# Patient Record
Sex: Female | Born: 1980 | Race: White | Hispanic: No | Marital: Single | Smoking: Current every day smoker
Health system: Southern US, Community
[De-identification: ages and names within clinical notes are randomized; demographics above are authoritative.]

## PROBLEM LIST (undated history)

## (undated) DIAGNOSIS — Z87442 Personal history of urinary calculi: Secondary | ICD-10-CM

## (undated) DIAGNOSIS — N189 Chronic kidney disease, unspecified: Secondary | ICD-10-CM

## (undated) DIAGNOSIS — R87629 Unspecified abnormal cytological findings in specimens from vagina: Secondary | ICD-10-CM

## (undated) HISTORY — DX: Unspecified abnormal cytological findings in specimens from vagina: R87.629

## (undated) HISTORY — PX: LEEP: SHX91

---

## 2002-05-14 ENCOUNTER — Emergency Department (HOSPITAL_COMMUNITY): Admission: EM | Admit: 2002-05-14 | Discharge: 2002-05-14 | Payer: Self-pay | Admitting: *Deleted

## 2003-03-06 ENCOUNTER — Emergency Department (HOSPITAL_COMMUNITY): Admission: EM | Admit: 2003-03-06 | Discharge: 2003-03-06 | Payer: Self-pay | Admitting: Emergency Medicine

## 2003-07-21 ENCOUNTER — Ambulatory Visit (HOSPITAL_COMMUNITY): Admission: RE | Admit: 2003-07-21 | Discharge: 2003-07-21 | Payer: Self-pay | Admitting: Orthopaedic Surgery

## 2010-07-26 ENCOUNTER — Ambulatory Visit (HOSPITAL_COMMUNITY)
Admission: RE | Admit: 2010-07-26 | Discharge: 2010-07-26 | Disposition: A | Discharge status: Home / Self Care | Payer: 59 | Source: Ambulatory Visit | Attending: Orthopaedic Surgery | Admitting: Orthopaedic Surgery

## 2010-07-26 DIAGNOSIS — M545 Low back pain, unspecified: Secondary | ICD-10-CM | POA: Insufficient documentation

## 2010-07-26 DIAGNOSIS — M6281 Muscle weakness (generalized): Secondary | ICD-10-CM | POA: Insufficient documentation

## 2010-07-26 DIAGNOSIS — IMO0001 Reserved for inherently not codable concepts without codable children: Secondary | ICD-10-CM | POA: Insufficient documentation

## 2010-07-30 ENCOUNTER — Ambulatory Visit (HOSPITAL_COMMUNITY)
Admission: RE | Admit: 2010-07-30 | Discharge: 2010-07-30 | Disposition: A | Discharge status: Home / Self Care | Payer: 59 | Source: Ambulatory Visit | Attending: Orthopaedic Surgery | Admitting: Orthopaedic Surgery

## 2010-07-30 DIAGNOSIS — M545 Low back pain, unspecified: Secondary | ICD-10-CM | POA: Insufficient documentation

## 2010-07-30 DIAGNOSIS — M6281 Muscle weakness (generalized): Secondary | ICD-10-CM | POA: Insufficient documentation

## 2010-07-30 DIAGNOSIS — IMO0001 Reserved for inherently not codable concepts without codable children: Secondary | ICD-10-CM | POA: Insufficient documentation

## 2010-08-02 ENCOUNTER — Ambulatory Visit (HOSPITAL_COMMUNITY): Payer: 59 | Admitting: Physical Therapy

## 2010-08-05 ENCOUNTER — Ambulatory Visit (HOSPITAL_COMMUNITY)
Admission: RE | Admit: 2010-08-05 | Discharge: 2010-08-05 | Disposition: A | Discharge status: Home / Self Care | Payer: 59 | Source: Ambulatory Visit | Attending: Orthopaedic Surgery | Admitting: Orthopaedic Surgery

## 2010-08-09 ENCOUNTER — Ambulatory Visit (HOSPITAL_COMMUNITY)
Admission: RE | Admit: 2010-08-09 | Discharge: 2010-08-09 | Disposition: A | Discharge status: Home / Self Care | Payer: 59 | Source: Ambulatory Visit | Attending: *Deleted | Admitting: *Deleted

## 2010-08-12 ENCOUNTER — Ambulatory Visit (HOSPITAL_COMMUNITY)
Admission: RE | Admit: 2010-08-12 | Discharge: 2010-08-12 | Disposition: A | Discharge status: Home / Self Care | Payer: 59 | Source: Ambulatory Visit | Attending: *Deleted | Admitting: *Deleted

## 2012-08-04 ENCOUNTER — Encounter (HOSPITAL_COMMUNITY): Payer: Self-pay | Admitting: *Deleted

## 2012-08-04 ENCOUNTER — Emergency Department (HOSPITAL_COMMUNITY)
Admission: EM | Admit: 2012-08-04 | Discharge: 2012-08-04 | Disposition: A | Discharge status: Home / Self Care | Payer: 59 | Attending: Emergency Medicine | Admitting: Emergency Medicine

## 2012-08-04 ENCOUNTER — Emergency Department (HOSPITAL_COMMUNITY): Payer: 59

## 2012-08-04 DIAGNOSIS — Z3202 Encounter for pregnancy test, result negative: Secondary | ICD-10-CM | POA: Insufficient documentation

## 2012-08-04 DIAGNOSIS — N201 Calculus of ureter: Secondary | ICD-10-CM | POA: Insufficient documentation

## 2012-08-04 DIAGNOSIS — Z9889 Other specified postprocedural states: Secondary | ICD-10-CM | POA: Insufficient documentation

## 2012-08-04 DIAGNOSIS — R112 Nausea with vomiting, unspecified: Secondary | ICD-10-CM | POA: Insufficient documentation

## 2012-08-04 DIAGNOSIS — F17200 Nicotine dependence, unspecified, uncomplicated: Secondary | ICD-10-CM | POA: Insufficient documentation

## 2012-08-04 LAB — URINALYSIS, ROUTINE W REFLEX MICROSCOPIC
Bilirubin Urine: NEGATIVE
Glucose, UA: NEGATIVE mg/dL
Ketones, ur: NEGATIVE mg/dL
Protein, ur: NEGATIVE mg/dL
pH: 6 (ref 5.0–8.0)

## 2012-08-04 MED ORDER — OXYCODONE-ACETAMINOPHEN 5-325 MG PO TABS: 2.0000 | ORAL_TABLET | ORAL | Status: DC | PRN

## 2012-08-04 MED ORDER — OXYCODONE-ACETAMINOPHEN 5-325 MG PO TABS
2.0000 | ORAL_TABLET | Freq: Once | ORAL | Status: AC
  Administered 2012-08-04: 2 via ORAL
  Filled 2012-08-04: qty 2

## 2012-08-04 MED ORDER — HYDROMORPHONE HCL PF 1 MG/ML IJ SOLN
1.0000 mg | Freq: Once | INTRAMUSCULAR | Status: AC
  Administered 2012-08-04: 1 mg via INTRAVENOUS
  Filled 2012-08-04: qty 1

## 2012-08-04 MED ORDER — ONDANSETRON HCL 4 MG PO TABS: 4.0000 mg | ORAL_TABLET | Freq: Four times a day (QID) | ORAL | Status: DC

## 2012-08-04 MED ORDER — KETOROLAC TROMETHAMINE 30 MG/ML IJ SOLN
30.0000 mg | Freq: Once | INTRAMUSCULAR | Status: AC
  Administered 2012-08-04: 30 mg via INTRAVENOUS
  Filled 2012-08-04 (×2): qty 1

## 2012-08-04 MED ORDER — ONDANSETRON HCL 4 MG/2ML IJ SOLN
4.0000 mg | Freq: Once | INTRAMUSCULAR | Status: AC
  Administered 2012-08-04: 4 mg via INTRAVENOUS
  Filled 2012-08-04: qty 2

## 2012-08-04 MED ORDER — SODIUM CHLORIDE 0.9 % IV SOLN
INTRAVENOUS | Status: DC
  Administered 2012-08-04: 1000 mL via INTRAVENOUS

## 2012-08-04 NOTE — ED Notes (Signed)
Pt reports right sided flank pain that started about 1 hour ago. Pt w a history of kidney infections. Vomiting started after the pain.

## 2012-08-04 NOTE — ED Provider Notes (Signed)
History     CSN: [id number]  Arrival date & time 08/04/12  0022   First MD Initiated Contact with Patient 08/04/12 0037      Chief Complaint  Patient presents with  . Flank Pain    (Consider location/radiation/quality/duration/timing/severity/associated sxs/prior treatment) HPI Hx per PT, R flank pain onset tonight with N/V, pain is severe, sharp not radiating, no associated hematuria or dysuria, no F/C. No trauma. No h/o same. Pain constant and no known aggrevating or alleviating factors History reviewed. No pertinent past medical history.  Past Surgical History  Procedure Laterality Date  . Cesarean section      History reviewed. No pertinent family history.  History  Substance Use Topics  . Smoking status: Current Every Day Smoker -- 0.50 packs/day    Types: Cigarettes  . Smokeless tobacco: Not on file  . Alcohol Use: Yes     Comment: occ    OB History   Grav Para Term Preterm Abortions TAB SAB Ect Mult Living                  Review of Systems  Constitutional: Negative for fever and chills.  HENT: Negative for neck pain and neck stiffness.   Eyes: Negative for pain.  Respiratory: Negative for shortness of breath.   Cardiovascular: Negative for chest pain.  Gastrointestinal: Negative for abdominal pain.  Genitourinary: Positive for flank pain. Negative for dysuria.  Musculoskeletal: Negative for back pain.  Skin: Negative for rash.  Neurological: Negative for headaches.  All other systems reviewed and are negative.    Allergies  Review of patient's allergies indicates no known allergies.  Home Medications   Current Outpatient Rx  Name  Route  Sig  Dispense  Refill  . naproxen (NAPROSYN) 250 MG tablet   Oral   Take 250 mg by mouth 2 (two) times daily with a meal.         . ondansetron (ZOFRAN) 4 MG tablet   Oral   Take 1 tablet (4 mg total) by mouth every 6 (six) hours.   12 tablet   0   . oxyCODONE-acetaminophen (PERCOCET/ROXICET) 5-325 MG  per tablet   Oral   Take 2 tablets by mouth every 4 (four) hours as needed for pain.   15 tablet   0     BP 126/79  Pulse 88  Temp(Src) 98.4 F (36.9 C) (Oral)  Resp 18  Ht 5\' 4"  (1.626 m)  Wt 140 lb (63.504 kg)  BMI 24.02 kg/m2  SpO2 94%  Physical Exam  Constitutional: She is oriented to person, place, and time. She appears well-developed and well-nourished.  HENT:  Head: Normocephalic and atraumatic.  Eyes: EOM are normal. Pupils are equal, round, and reactive to light.  Neck: Neck supple.  Cardiovascular: Normal rate, regular rhythm and intact distal pulses.   Pulmonary/Chest: Effort normal and breath sounds normal. No respiratory distress.  Abdominal: She exhibits no distension. There is no guarding.  TTP R flank, neg Murphys sign, no ABD tenderness otherwise  Musculoskeletal: Normal range of motion. She exhibits no edema.  Neurological: She is alert and oriented to person, place, and time.  Skin: Skin is warm and dry.    ED Course  Procedures (including critical care time)  Labs Reviewed  URINALYSIS, ROUTINE W REFLEX MICROSCOPIC - Abnormal; Notable for the following:    Hgb urine dipstick LARGE (*)    All other components within normal limits  PREGNANCY, URINE  URINE MICROSCOPIC-ADD ON   Ct  Abdomen Pelvis Wo Contrast  08/04/2012  *RADIOLOGY REPORT*  Clinical Data: Right-sided flank pain for 2 hours.  CT ABDOMEN AND PELVIS WITHOUT CONTRAST  Technique:  Multidetector CT imaging of the abdomen and pelvis was performed following the standard protocol without intravenous contrast.  Comparison: MRI of the lumbar spine performed 07/21/2003  Findings: The visualized lung bases are clear.  The liver and spleen are unremarkable in appearance.  The gallbladder is within normal limits.  The pancreas and adrenal glands are unremarkable.  There is minimal right-sided hydronephrosis, with prominence of the right ureter along its entire course, and an obstructing 5 x 4 mm stone noted  distally, 2 cm proximal to the right vesicoureteral junction.  The left kidney is unremarkable in appearance.  No non-obstructing renal stones are identified.  No perinephric stranding is seen.  No free fluid is identified.  The small bowel is unremarkable in appearance.  The stomach is within normal limits.  No acute vascular abnormalities are seen.  The appendix is normal in caliber and contains air, without evidence for appendicitis.  The colon is unremarkable in appearance.  The bladder is decompressed and not well assessed.  The uterus is grossly unremarkable in appearance.  The ovaries are relatively symmetric; no suspicious adnexal masses are seen.  No inguinal lymphadenopathy is seen.  No acute osseous abnormalities are identified.  IMPRESSION: Minimal right-sided hydronephrosis, with an obstructing 5 x 4 mm stone distally in the right ureter, 2 cm proximal to the right vesicoureteral junction.   Original Report Authenticated By: Tonia Ghent, M.D.      1. Ureterolithiasis    IVFs, IV Dilaudid, IV toradol, IV zofran  Pain improved - plan RX and Urology follow up with kidney stone precautions verbalized as understood  MDM  Flank pain. Ureterolithiasis - evaluated with CT scan as above, UA showing no UTI  Improved with IVfs and narcotics  VS and nursing notes reviewed        Sunnie Nielsen, MD 08/04/12 [phone]

## 2012-08-04 NOTE — ED Notes (Signed)
Pt alert & oriented x4, stable gait. Patient given discharge instructions, paperwork & prescription(s). Patient  instructed to stop at the registration desk to finish any additional paperwork. Patient verbalized understanding. Pt left department w/ no further questions. 

## 2012-08-06 ENCOUNTER — Other Ambulatory Visit: Payer: Self-pay | Admitting: Urology

## 2012-08-07 ENCOUNTER — Encounter (HOSPITAL_COMMUNITY): Payer: Self-pay | Admitting: *Deleted

## 2012-08-07 ENCOUNTER — Emergency Department (HOSPITAL_COMMUNITY)
Admission: EM | Admit: 2012-08-07 | Discharge: 2012-08-08 | Disposition: A | Discharge status: Home / Self Care | Payer: 59 | Attending: Emergency Medicine | Admitting: Emergency Medicine

## 2012-08-07 DIAGNOSIS — N189 Chronic kidney disease, unspecified: Secondary | ICD-10-CM | POA: Insufficient documentation

## 2012-08-07 DIAGNOSIS — R350 Frequency of micturition: Secondary | ICD-10-CM | POA: Insufficient documentation

## 2012-08-07 DIAGNOSIS — N23 Unspecified renal colic: Secondary | ICD-10-CM | POA: Insufficient documentation

## 2012-08-07 DIAGNOSIS — N201 Calculus of ureter: Secondary | ICD-10-CM | POA: Insufficient documentation

## 2012-08-07 DIAGNOSIS — R319 Hematuria, unspecified: Secondary | ICD-10-CM | POA: Insufficient documentation

## 2012-08-07 DIAGNOSIS — Z79899 Other long term (current) drug therapy: Secondary | ICD-10-CM | POA: Insufficient documentation

## 2012-08-07 DIAGNOSIS — F17200 Nicotine dependence, unspecified, uncomplicated: Secondary | ICD-10-CM | POA: Insufficient documentation

## 2012-08-07 DIAGNOSIS — R110 Nausea: Secondary | ICD-10-CM | POA: Insufficient documentation

## 2012-08-07 NOTE — Pre-Procedure Instructions (Signed)
Asked to bring blue folder the day of the procedure,insurance card,I.D. driver's license,wear comfortable clothing and have a driver for the day. Asked not to take Advil,Motrin,Ibuprofen,Aleve or any NSAIDS, Aspirin, or Toradol for 72 hours prior to procedure,  No vitamins or herbal medications 7 days prior to procedure. Instructed to take laxative per doctor's office instructions and eat a light dinner the evening before procedure.   To arrive at 0530  for lithotripsy procedure. 

## 2012-08-07 NOTE — ED Notes (Signed)
Pt reporting pain in right flank.  States that she was seen here on Sat and already been diagnosed with kidney stones.  Has appointment for lithotripsy on Monday.  States that her nephrologist told her to come to ED since prescribed pain medications weren't managing pain.

## 2012-08-08 ENCOUNTER — Encounter (HOSPITAL_COMMUNITY): Payer: Self-pay | Admitting: Pharmacy Technician

## 2012-08-08 LAB — URINALYSIS, ROUTINE W REFLEX MICROSCOPIC
Bilirubin Urine: NEGATIVE
Glucose, UA: NEGATIVE mg/dL
Specific Gravity, Urine: 1.02 (ref 1.005–1.030)
Urobilinogen, UA: 0.2 mg/dL (ref 0.0–1.0)
pH: 5.5 (ref 5.0–8.0)

## 2012-08-08 LAB — URINE MICROSCOPIC-ADD ON

## 2012-08-08 MED ORDER — HYDROMORPHONE HCL 2 MG PO TABS
ORAL_TABLET | ORAL | Status: AC
  Filled 2012-08-08: qty 1

## 2012-08-08 MED ORDER — HYDROMORPHONE HCL 2 MG PO TABS
2.0000 mg | ORAL_TABLET | Freq: Once | ORAL | Status: AC
  Administered 2012-08-08: 2 mg via ORAL

## 2012-08-08 MED ORDER — HYDROMORPHONE HCL 2 MG PO TABS
4.0000 mg | ORAL_TABLET | Freq: Once | ORAL | Status: AC
  Administered 2012-08-08: 4 mg via ORAL

## 2012-08-08 MED ORDER — HYDROMORPHONE HCL 4 MG PO TABS: 4.0000 mg | ORAL_TABLET | ORAL | Status: DC | PRN

## 2012-08-08 MED ORDER — ONDANSETRON 8 MG PO TBDP
8.0000 mg | ORAL_TABLET | Freq: Once | ORAL | Status: AC
  Administered 2012-08-08: 8 mg via ORAL

## 2012-08-08 MED ORDER — HYDROMORPHONE HCL 2 MG PO TABS
ORAL_TABLET | ORAL | Status: AC
  Filled 2012-08-08: qty 2

## 2012-08-08 MED ORDER — ONDANSETRON 8 MG PO TBDP
ORAL_TABLET | ORAL | Status: AC
  Filled 2012-08-08: qty 1

## 2012-08-08 NOTE — ED Notes (Signed)
See downtime paperwork on patient.

## 2012-08-11 NOTE — ED Provider Notes (Signed)
History     CSN: [id number]  Arrival date & time 08/07/12  2322   First MD Initiated Contact with Patient 08/08/12 0016      Chief Complaint  Patient presents with  . Flank Pain    (Consider location/radiation/quality/duration/timing/severity/associated sxs/prior treatment) HPI Comments: Wendy Powell is a 32 y.o. Female presenting with persistent right flank pain despite taking oxycodone which she was prescribed here 4 days ago at which time she was diagnosed with a right UVJ stone measuring 5 x 4 mm.  She is scheduled for lithotripsy with Dr. Berneice Heinrich in 5 days.  She has had nausea without emesis and denies fever,  But has has pain only minimally controlled with the oxycodone prescribed.  She has taken no other medicines for pain relief.  She has had urinary frequency and hematuria. Her pain is waxing and waning with radiation into her right groin.  She has found no alleviators or aggrevators.     The history is provided by the patient.    Past Medical History  Diagnosis Date  . Chronic kidney disease     kidney stone    Past Surgical History  Procedure Laterality Date  . Cesarean section      History reviewed. No pertinent family history.  History  Substance Use Topics  . Smoking status: Current Every Day Smoker -- 0.50 packs/day for 17 years    Types: Cigarettes  . Smokeless tobacco: Never Used  . Alcohol Use: Yes     Comment: occ    OB History   Grav Para Term Preterm Abortions TAB SAB Ect Mult Living                  Review of Systems  Constitutional: Negative for fever.  HENT: Negative for congestion, sore throat and neck pain.   Eyes: Negative.   Respiratory: Negative for chest tightness and shortness of breath.   Cardiovascular: Negative for chest pain.  Gastrointestinal: Negative for nausea and abdominal pain.  Genitourinary: Positive for hematuria and flank pain.  Musculoskeletal: Negative for joint swelling and arthralgias.  Skin: Negative.   Negative for rash and wound.  Neurological: Negative for dizziness, weakness, light-headedness, numbness and headaches.  Psychiatric/Behavioral: Negative.     Allergies  Review of patient's allergies indicates no known allergies.  Home Medications   Current Outpatient Rx  Name  Route  Sig  Dispense  Refill  . HYDROmorphone (DILAUDID) 4 MG tablet   Oral   Take 1 tablet (4 mg total) by mouth every 4 (four) hours as needed for pain.   30 tablet   0   . ondansetron (ZOFRAN) 4 MG tablet   Oral   Take 1 tablet (4 mg total) by mouth every 6 (six) hours.   12 tablet   0   . oxyCODONE-acetaminophen (PERCOCET/ROXICET) 5-325 MG per tablet   Oral   Take 2 tablets by mouth every 4 (four) hours as needed for pain.   15 tablet   0   . tamsulosin (FLOMAX) 0.4 MG CAPS   Oral   Take by mouth every morning.           BP 116/77  Pulse 87  Temp(Src) 98.9 F (37.2 C) (Oral)  Resp 18  Ht 5\' 4"  (1.626 m)  Wt 140 lb (63.504 kg)  BMI 24.02 kg/m2  SpO2 98%  Physical Exam  Nursing note and vitals reviewed. Constitutional: She appears well-developed and well-nourished.  HENT:  Head: Normocephalic and atraumatic.  Eyes:  Conjunctivae are normal.  Neck: Normal range of motion.  Cardiovascular: Normal rate, regular rhythm, normal heart sounds and intact distal pulses.   Pulmonary/Chest: Effort normal and breath sounds normal. She has no wheezes.  Abdominal: Soft. Bowel sounds are normal. There is no tenderness. There is no rebound and no guarding.  Musculoskeletal: Normal range of motion.  Neurological: She is alert.  Skin: Skin is warm and dry.  Psychiatric: She has a normal mood and affect.    ED Course  Procedures (including critical care time)  Labs Reviewed  URINALYSIS, ROUTINE W REFLEX MICROSCOPIC - Abnormal; Notable for the following:    Hgb urine dipstick MODERATE (*)    Ketones, ur TRACE (*)    All other components within normal limits  URINE MICROSCOPIC-ADD ON -  Abnormal; Notable for the following:    Squamous Epithelial / LPF FEW (*)    Bacteria, UA FEW (*)    All other components within normal limits   No results found.   1. Right ureteral stone   2. Ureteral colic     Pt was given zofran and dilaudid 4 mg tablet with moderate relief of pain,  From 10/10 to 4/10.  Repeated dilaudid 2 mg tablet prior to dc home with pain reduced to 2/10,  Pt stating she believes she can sleep tonight if pain stays at this level.    MDM  Urinalysis reviewed,  Culture sent.  Suspect few bacteria contamination given few epithelial cells also.  She was prescribed dilaudid.  Encouraged return here or call to Dr Berneice Heinrich for any worsened sx,  Including fevers,  Uncontrolled emesis or pain.  Pt understands plan.        Burgess Amor, PA-C 08/11/12 1003

## 2012-08-12 NOTE — ED Provider Notes (Signed)
Medical screening examination/treatment/procedure(s) were performed by non-physician practitioner and as supervising physician I was immediately available for consultation/collaboration.  Emryn Flanery S. Mandisa Persinger, MD 08/12/12 0644 

## 2012-08-13 ENCOUNTER — Ambulatory Visit (HOSPITAL_COMMUNITY): Admission: RE | Admit: 2012-08-13 | Payer: 59 | Source: Ambulatory Visit | Admitting: Urology

## 2012-08-13 HISTORY — DX: Chronic kidney disease, unspecified: N18.9

## 2012-08-13 SURGERY — LITHOTRIPSY, ESWL
Anesthesia: LOCAL | Laterality: Right

## 2015-06-02 DIAGNOSIS — H52531 Spasm of accommodation, right eye: Secondary | ICD-10-CM | POA: Diagnosis not present

## 2015-07-20 DIAGNOSIS — Z3042 Encounter for surveillance of injectable contraceptive: Secondary | ICD-10-CM | POA: Diagnosis not present

## 2015-07-20 DIAGNOSIS — Z3202 Encounter for pregnancy test, result negative: Secondary | ICD-10-CM | POA: Diagnosis not present

## 2015-09-08 DIAGNOSIS — B351 Tinea unguium: Secondary | ICD-10-CM | POA: Diagnosis not present

## 2015-09-16 DIAGNOSIS — N898 Other specified noninflammatory disorders of vagina: Secondary | ICD-10-CM | POA: Diagnosis not present

## 2015-10-15 DIAGNOSIS — Z3042 Encounter for surveillance of injectable contraceptive: Secondary | ICD-10-CM | POA: Diagnosis not present

## 2015-10-22 ENCOUNTER — Other Ambulatory Visit: Payer: Self-pay | Admitting: Orthopedic Surgery

## 2015-10-22 MED ORDER — CYCLOBENZAPRINE HCL 5 MG PO TABS: 5.0000 mg | ORAL_TABLET | Freq: Three times a day (TID) | ORAL | Status: DC | PRN

## 2015-10-22 NOTE — Progress Notes (Signed)
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 [facility]Keeling [facility]  Patient's prescription has run out  She complains of back pain, without leg pain bowel or bladder dysfunction no numbness or tingling  Medication refilled

## 2015-10-27 DIAGNOSIS — Z6823 Body mass index (BMI) 23.0-23.9, adult: Secondary | ICD-10-CM | POA: Diagnosis not present

## 2015-10-27 DIAGNOSIS — Z01419 Encounter for gynecological examination (general) (routine) without abnormal findings: Secondary | ICD-10-CM | POA: Diagnosis not present

## 2016-01-13 DIAGNOSIS — Z3042 Encounter for surveillance of injectable contraceptive: Secondary | ICD-10-CM | POA: Diagnosis not present

## 2016-04-08 DIAGNOSIS — Z3042 Encounter for surveillance of injectable contraceptive: Secondary | ICD-10-CM | POA: Diagnosis not present

## 2016-06-14 DIAGNOSIS — H5213 Myopia, bilateral: Secondary | ICD-10-CM | POA: Diagnosis not present

## 2016-06-14 DIAGNOSIS — H43393 Other vitreous opacities, bilateral: Secondary | ICD-10-CM | POA: Diagnosis not present

## 2016-07-08 DIAGNOSIS — Z3042 Encounter for surveillance of injectable contraceptive: Secondary | ICD-10-CM | POA: Diagnosis not present

## 2016-10-04 DIAGNOSIS — Z3042 Encounter for surveillance of injectable contraceptive: Secondary | ICD-10-CM | POA: Diagnosis not present

## 2016-10-31 DIAGNOSIS — Z6825 Body mass index (BMI) 25.0-25.9, adult: Secondary | ICD-10-CM | POA: Diagnosis not present

## 2016-10-31 DIAGNOSIS — Z01419 Encounter for gynecological examination (general) (routine) without abnormal findings: Secondary | ICD-10-CM | POA: Diagnosis not present

## 2017-01-11 DIAGNOSIS — Z3042 Encounter for surveillance of injectable contraceptive: Secondary | ICD-10-CM | POA: Diagnosis not present

## 2017-03-15 ENCOUNTER — Emergency Department (HOSPITAL_COMMUNITY)
Admission: EM | Admit: 2017-03-15 | Discharge: 2017-03-15 | Disposition: A | Discharge status: Home / Self Care | Payer: PRIVATE HEALTH INSURANCE | Attending: Emergency Medicine | Admitting: Emergency Medicine

## 2017-03-15 ENCOUNTER — Encounter (HOSPITAL_COMMUNITY): Payer: Self-pay | Admitting: Emergency Medicine

## 2017-03-15 DIAGNOSIS — R42 Dizziness and giddiness: Secondary | ICD-10-CM | POA: Diagnosis not present

## 2017-03-15 DIAGNOSIS — Z77098 Contact with and (suspected) exposure to other hazardous, chiefly nonmedicinal, chemicals: Secondary | ICD-10-CM | POA: Insufficient documentation

## 2017-03-15 DIAGNOSIS — N189 Chronic kidney disease, unspecified: Secondary | ICD-10-CM | POA: Diagnosis not present

## 2017-03-15 DIAGNOSIS — F17210 Nicotine dependence, cigarettes, uncomplicated: Secondary | ICD-10-CM | POA: Insufficient documentation

## 2017-03-15 DIAGNOSIS — R070 Pain in throat: Secondary | ICD-10-CM | POA: Insufficient documentation

## 2017-03-15 DIAGNOSIS — Z79899 Other long term (current) drug therapy: Secondary | ICD-10-CM | POA: Insufficient documentation

## 2017-03-15 NOTE — ED Triage Notes (Signed)
Pt was exposed to formaldehyde spill in OR. Pt initially c/o dizziness, burning to throat and eyes and runny nose. Pt took decon shower and reports some improvement in symptoms. nad noted.

## 2017-03-15 NOTE — ED Provider Notes (Signed)
[facility] EMERGENCY DEPARTMENT Provider Note   CSN: [id number] Arrival date & time: 03/15/17  1008     History   Chief Complaint No chief complaint on file.   HPI Wendy Powell is a 36 y.o. female.  HPI  36 year old female presenting for evaluation of exposure to formaldehyde fume.  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  During the surgery, a large formaldehyde bottle with specimen fell to the ground and approximately of liquid were exposed to the air.  It was approximately 30 minutes of inhalation chemical exposure before the OR patient was safely extubated and the staff can leave the room.  She initially complaining of dizziness, burning to throat and eyes and runny nose.  After taking decon shower she report improvement of sxs.  No hx of lung disease.    Past Medical History:  Diagnosis Date  . Chronic kidney disease    kidney stone    There are no active problems to display for this patient.   Past Surgical History:  Procedure Laterality Date  . CESAREAN SECTION      OB History    No data available       Home Medications    Prior to Admission medications   Medication Sig Start Date End Date Taking? Authorizing Provider  cyclobenzaprine (FLEXERIL) 5 MG tablet Take 1 tablet (5 mg total) by mouth 3 (three) times daily as needed for muscle spasms. 10/22/15   Vickki Hearing, MD  HYDROmorphone (DILAUDID) 4 MG tablet Take 1 tablet (4 mg total) by mouth every 4 (four) hours as needed for pain. 08/08/12   Burgess Amor, PA-C  ondansetron (ZOFRAN) 4 MG tablet Take 1 tablet (4 mg total) by mouth every 6 (six) hours. 08/04/12   Sunnie Nielsen, MD  oxyCODONE-acetaminophen (PERCOCET/ROXICET) 5-325 MG per tablet Take 2 tablets by mouth every 4 (four) hours as needed for pain. 08/04/12   Sunnie Nielsen, MD  tamsulosin (FLOMAX) 0.4 MG CAPS Take by mouth every morning.    [provider]    Family History No family  history on file.  Social History Social History  Substance Use Topics  . Smoking status: Current Every Day Smoker    Packs/day: 0.50    Years: 17.00    Types: Cigarettes  . Smokeless tobacco: Never Used  . Alcohol use Yes     Comment: occ     Allergies   Patient has no known allergies.   Review of Systems Review of Systems  All other systems reviewed and are negative.    Physical Exam Updated Vital Signs There were no vitals taken for this visit.  Physical Exam  Constitutional: She appears well-developed and well-nourished. No distress.  HENT:  Head: Atraumatic.  Eyes: Conjunctivae are normal.  Neck: Neck supple.  Cardiovascular: Normal rate and regular rhythm.   Murmur heard. Pulmonary/Chest: Effort normal and breath sounds normal.  Abdominal: Soft.  Neurological: She is alert.  Skin: Skin is warm. No rash noted.  Psychiatric: She has a normal mood and affect.  Nursing note and vitals reviewed.    ED Treatments / Results  Labs (all labs ordered are listed, but only abnormal results are displayed) Labs Reviewed - No data to display  EKG  EKG Interpretation None       Radiology No results found.  Procedures Procedures (including critical care time)  Medications Ordered in ED Medications - No data  to display   Initial Impression / Assessment and Plan / ED Course  I have reviewed the triage vital signs and the nursing notes.  Pertinent labs & imaging results that were available during my care of the patient were reviewed by me and considered in my medical decision making (see chart for details).     BP 120/81 (BP Location: Left Arm)   Pulse 78   Resp 18   SpO2 100%    Final Clinical Impressions(s) / ED Diagnoses   Final diagnoses:  Exposure to chemical inhalation    New Prescriptions New Prescriptions   No medications on file   11:09 AM Pt here for inhalation exposure to formaldehyde while working in the OR today.  Approximately  30 minutes of exposure time. Supportive measures including removing of contaminated clothing, and the skin is thoroughly washed.  An [facility]ident [facility] have been set up to care for the staff with exposures.    Pt is currently sxs free.  She has been monitored for the past 1-2 hrs.  Felt stable for discharge.  Return precaution given.    Fayrene Helperran, Marilea Gwynne, PA-C 03/15/17 1147    Margarita Grizzleay, Danielle, MD 03/15/17 [phone]

## 2017-04-11 DIAGNOSIS — Z3042 Encounter for surveillance of injectable contraceptive: Secondary | ICD-10-CM | POA: Diagnosis not present

## 2017-06-26 DIAGNOSIS — H2513 Age-related nuclear cataract, bilateral: Secondary | ICD-10-CM | POA: Diagnosis not present

## 2017-06-26 DIAGNOSIS — H43393 Other vitreous opacities, bilateral: Secondary | ICD-10-CM | POA: Diagnosis not present

## 2017-07-13 DIAGNOSIS — Z3202 Encounter for pregnancy test, result negative: Secondary | ICD-10-CM | POA: Diagnosis not present

## 2017-07-13 DIAGNOSIS — Z3042 Encounter for surveillance of injectable contraceptive: Secondary | ICD-10-CM | POA: Diagnosis not present

## 2017-10-06 DIAGNOSIS — Z3042 Encounter for surveillance of injectable contraceptive: Secondary | ICD-10-CM | POA: Diagnosis not present

## 2017-12-27 DIAGNOSIS — Z3042 Encounter for surveillance of injectable contraceptive: Secondary | ICD-10-CM | POA: Diagnosis not present

## 2018-03-15 DIAGNOSIS — Z3042 Encounter for surveillance of injectable contraceptive: Secondary | ICD-10-CM | POA: Diagnosis not present

## 2018-06-20 DIAGNOSIS — Z3042 Encounter for surveillance of injectable contraceptive: Secondary | ICD-10-CM | POA: Diagnosis not present

## 2018-06-20 DIAGNOSIS — Z01419 Encounter for gynecological examination (general) (routine) without abnormal findings: Secondary | ICD-10-CM | POA: Diagnosis not present

## 2018-09-06 DIAGNOSIS — Z3042 Encounter for surveillance of injectable contraceptive: Secondary | ICD-10-CM | POA: Diagnosis not present

## 2018-11-28 ENCOUNTER — Emergency Department (HOSPITAL_COMMUNITY)
Admission: EM | Admit: 2018-11-28 | Discharge: 2018-11-29 | Disposition: A | Discharge status: Home / Self Care | Payer: 59 | Attending: Emergency Medicine | Admitting: Emergency Medicine

## 2018-11-28 ENCOUNTER — Other Ambulatory Visit: Payer: Self-pay

## 2018-11-28 ENCOUNTER — Encounter (HOSPITAL_COMMUNITY): Payer: Self-pay | Admitting: Emergency Medicine

## 2018-11-28 DIAGNOSIS — R102 Pelvic and perineal pain unspecified side: Secondary | ICD-10-CM

## 2018-11-28 DIAGNOSIS — N189 Chronic kidney disease, unspecified: Secondary | ICD-10-CM | POA: Insufficient documentation

## 2018-11-28 DIAGNOSIS — F17210 Nicotine dependence, cigarettes, uncomplicated: Secondary | ICD-10-CM | POA: Insufficient documentation

## 2018-11-28 DIAGNOSIS — R1032 Left lower quadrant pain: Secondary | ICD-10-CM | POA: Diagnosis present

## 2018-11-28 DIAGNOSIS — N83202 Unspecified ovarian cyst, left side: Secondary | ICD-10-CM

## 2018-11-28 DIAGNOSIS — R109 Unspecified abdominal pain: Secondary | ICD-10-CM | POA: Diagnosis not present

## 2018-11-28 DIAGNOSIS — Z3042 Encounter for surveillance of injectable contraceptive: Secondary | ICD-10-CM | POA: Diagnosis not present

## 2018-11-28 MED ORDER — ONDANSETRON HCL 4 MG/2ML IJ SOLN
4.0000 mg | Freq: Once | INTRAMUSCULAR | Status: AC
  Administered 2018-11-29: 4 mg via INTRAVENOUS
  Filled 2018-11-28: qty 2

## 2018-11-28 MED ORDER — HYDROMORPHONE HCL 1 MG/ML IJ SOLN
1.0000 mg | Freq: Once | INTRAMUSCULAR | Status: AC
  Administered 2018-11-29: 1 mg via INTRAVENOUS
  Filled 2018-11-28: qty 1

## 2018-11-28 NOTE — ED Triage Notes (Signed)
Pt c/o left lower flank pain starting yesterday.

## 2018-11-28 NOTE — ED Provider Notes (Signed)
[facility] EMERGENCY DEPARTMENT Provider Note   CSN: [id number] Arrival date & time: 11/28/18  2245    History   Chief Complaint Chief Complaint  Patient presents with   Flank Pain    HPI Wendy Powell is a 38 y.o. female.     Patient presents to the emergency department for evaluation of left flank pain.  Patient reports severe and constant pain in the left flank radiating to the left groin with nausea.  Symptoms began yesterday and have been waxing and waning, now for the last few hours constant and severe.  She has not noticed any urinary symptoms.     Past Medical History:  Diagnosis Date   Chronic kidney disease    kidney stone    There are no active problems to display for this patient.   Past Surgical History:  Procedure Laterality Date   CESAREAN SECTION       OB History   No obstetric history on file.      Home Medications    Prior to Admission medications   Medication Sig Start Date End Date Taking? Authorizing Provider  medroxyPROGESTERone (DEPO-PROVERA) 150 MG/ML injection Inject 150 mg into the muscle every 3 (three) months.  11/27/18  Yes [provider]  HYDROcodone-acetaminophen (NORCO/VICODIN) 5-325 MG tablet Take 1-2 tablets by mouth every 4 (four) hours as needed. 11/29/18   Gilda CreasePollina, Tylyn Derwin J, MD  ibuprofen (ADVIL) 800 MG tablet Take 1 tablet (800 mg total) by mouth every 6 (six) hours as needed for moderate pain. 11/29/18   Gilda CreasePollina, Leba Tibbitts J, MD    Family History History reviewed. No pertinent family history.  Social History Social History   Tobacco Use   Smoking status: Current Every Day Smoker    Packs/day: 0.50    Years: 17.00    Pack years: 8.50    Types: Cigarettes   Smokeless tobacco: Never Used  Substance Use Topics   Alcohol use: Yes    Comment: occ   Drug use: No     Allergies   Patient has no known allergies.   Review of Systems Review of Systems  Genitourinary: Positive for flank pain.    All other systems reviewed and are negative.    Physical Exam Updated Vital Signs BP 125/77    Pulse 68    Temp 98.1 F (36.7 C) (Oral)    Resp 17    Ht 5\' 3"  (1.6 m)    Wt 68 kg    SpO2 100%    BMI 26.57 kg/m   Physical Exam Vitals signs and nursing note reviewed.  Constitutional:      General: She is in acute distress.     Appearance: Normal appearance. She is well-developed.  HENT:     Head: Normocephalic and atraumatic.     Right Ear: Hearing normal.     Left Ear: Hearing normal.     Nose: Nose normal.  Eyes:     Conjunctiva/sclera: Conjunctivae normal.     Pupils: Pupils are equal, round, and reactive to light.  Neck:     Musculoskeletal: Normal range of motion and neck supple.  Cardiovascular:     Rate and Rhythm: Regular rhythm.     Heart sounds: S1 normal and S2 normal. No murmur. No friction rub. No gallop.   Pulmonary:     Effort: Pulmonary effort is normal. No respiratory distress.     Breath sounds: Normal breath sounds.  Chest:     Chest wall: No tenderness.  Abdominal:     General: Bowel sounds are normal.     Palpations: Abdomen is soft.     Tenderness: There is no abdominal tenderness. There is no guarding or rebound. Negative signs include Murphy's sign and McBurney's sign.     Hernia: No hernia is present.  Musculoskeletal: Normal range of motion.  Skin:    General: Skin is warm and dry.     Findings: No rash.  Neurological:     Mental Status: She is alert and oriented to person, place, and time.     GCS: GCS eye subscore is 4. GCS verbal subscore is 5. GCS motor subscore is 6.     Cranial Nerves: No cranial nerve deficit.     Sensory: No sensory deficit.     Coordination: Coordination normal.  Psychiatric:        Speech: Speech normal.        Behavior: Behavior normal.        Thought Content: Thought content normal.      ED Treatments / Results  Labs (all labs ordered are listed, but only abnormal results are displayed) Labs Reviewed   WET PREP, GENITAL - Abnormal; Notable for the following components:      Result Value   WBC, Wet Prep HPF POC MODERATE (*)    All other components within normal limits  CBC WITH DIFFERENTIAL/PLATELET - Abnormal; Notable for the following components:   WBC 13.5 (*)    Neutro Abs 9.1 (*)    All other components within normal limits  BASIC METABOLIC PANEL - Abnormal; Notable for the following components:   CO2 21 (*)    Glucose, Bld 105 (*)    All other components within normal limits  URINALYSIS, ROUTINE W REFLEX MICROSCOPIC - Abnormal; Notable for the following components:   Color, Urine STRAW (*)    Hgb urine dipstick SMALL (*)    Ketones, ur 20 (*)    Bacteria, UA RARE (*)    All other components within normal limits  I-STAT BETA HCG BLOOD, ED ([facility], [facility], [facility] ONLY)  GC/CHLAMYDIA PROBE AMP ([place]) NOT AT [facility]    EKG None  Radiology [place] Pelvic Doppler (torsion R/o Or Mass Arterial Flow)  Result Date: 11/29/2018 CLINICAL DATA:  Left side pain EXAM: TRANSABDOMINAL AND TRANSVAGINAL ULTRASOUND OF PELVIS DOPPLER ULTRASOUND OF OVARIES TECHNIQUE: Both transabdominal and transvaginal ultrasound examinations of the pelvis were performed. Transabdominal technique was performed for global imaging of the pelvis including uterus, ovaries, adnexal regions, and pelvic cul-de-sac. It was necessary to proceed with endovaginal exam following the transabdominal exam to visualize the ovaries. Color and duplex Doppler ultrasound was utilized to evaluate blood flow to the ovaries. COMPARISON:  CT 11/29/2018 FINDINGS: Uterus Measurements: 6.3 x 2.9 x 4.3 cm = volume: 42 mL. No fibroids or other mass visualized. Endometrium Thickness: 8 mm in thickness.  No focal abnormality visualized. Right ovary Measurements: Not visualized.  No adnexal mass seen. Left ovary Measurements: 3.0 x 2.3 x 3.0 cm = volume: 10.6 mL. 2.7 cm dominant follicle. No adnexal mass. Pulsed Doppler evaluation of the left ovary demonstrates  normal low-resistance arterial and venous waveforms. Other findings No abnormal free fluid. IMPRESSION: No acute findings or significant abnormality. No evidence of ovarian torsion. Right ovary not visualized. Electronically Signed   By: Charlett NoseKevin  Dover M.D.   On: 11/29/2018 03:12   Ct Renal Stone Study  Result Date: 11/29/2018 CLINICAL DATA:  Flank pain EXAM: CT ABDOMEN AND PELVIS WITHOUT CONTRAST  TECHNIQUE: Multidetector CT imaging of the abdomen and pelvis was performed following the standard protocol without IV contrast. COMPARISON:  08/04/2012 FINDINGS: Lower chest: No acute abnormality. Hepatobiliary: No focal liver abnormality is seen. No gallstones, gallbladder wall thickening, or biliary dilatation. Pancreas: Unremarkable. No pancreatic ductal dilatation or surrounding inflammatory changes. Spleen: Normal in size without focal abnormality. Adrenals/Urinary Tract: Adrenal glands are unremarkable. Kidneys are normal, without renal calculi, focal lesion, or hydronephrosis. Bladder is unremarkable. Stomach/Bowel: Stomach is within normal limits. Appendix appears normal. No evidence of bowel wall thickening, distention, or inflammatory changes. Vascular/Lymphatic: No significant vascular findings are present. No enlarged abdominal or pelvic lymph nodes. Reproductive: Uterus and bilateral adnexa are unremarkable. 2.3 cm cyst in the left adnexa. Other: No abdominal wall hernia or abnormality. No abdominopelvic ascites. Musculoskeletal: No acute or significant osseous findings. IMPRESSION: 1. No CT evidence for hydronephrosis or ureteral stone. 2. No CT evidence for acute intra-abdominal or pelvic abnormality. Electronically Signed   By: Jasmine PangKim  Fujinaga M.D.   On: 11/29/2018 00:45   [place] Pelvic Complete With Transvaginal  Result Date: 11/29/2018 CLINICAL DATA:  Left side pain EXAM: TRANSABDOMINAL AND TRANSVAGINAL ULTRASOUND OF PELVIS DOPPLER ULTRASOUND OF OVARIES TECHNIQUE: Both transabdominal and transvaginal  ultrasound examinations of the pelvis were performed. Transabdominal technique was performed for global imaging of the pelvis including uterus, ovaries, adnexal regions, and pelvic cul-de-sac. It was necessary to proceed with endovaginal exam following the transabdominal exam to visualize the ovaries. Color and duplex Doppler ultrasound was utilized to evaluate blood flow to the ovaries. COMPARISON:  CT 11/29/2018 FINDINGS: Uterus Measurements: 6.3 x 2.9 x 4.3 cm = volume: 42 mL. No fibroids or other mass visualized. Endometrium Thickness: 8 mm in thickness.  No focal abnormality visualized. Right ovary Measurements: Not visualized.  No adnexal mass seen. Left ovary Measurements: 3.0 x 2.3 x 3.0 cm = volume: 10.6 mL. 2.7 cm dominant follicle. No adnexal mass. Pulsed Doppler evaluation of the left ovary demonstrates normal low-resistance arterial and venous waveforms. Other findings No abnormal free fluid. IMPRESSION: No acute findings or significant abnormality. No evidence of ovarian torsion. Right ovary not visualized. Electronically Signed   By: Charlett NoseKevin  Dover M.D.   On: 11/29/2018 03:12    Procedures Procedures (including critical care time)  Medications Ordered in ED Medications  HYDROmorphone (DILAUDID) injection 1 mg (1 mg Intravenous Given 11/29/18 0005)  ondansetron (ZOFRAN) injection 4 mg (4 mg Intravenous Given 11/29/18 0005)     Initial Impression / Assessment and Plan / ED Course  I have reviewed the triage vital signs and the nursing notes.  Pertinent labs & imaging results that were available during my care of the patient were reviewed by me and considered in my medical decision making (see chart for details).        Patient presents to the emergency department for evaluation of left flank and lower abdominal/pelvic pain that began yesterday.  Pain with waxing and waning effort but now is severe.  She did not have CVA tenderness, did have tenderness in the left lower quadrant.  Patient  underwent CT scan that did not show any evidence of kidney stone or inflammatory process such as diverticulitis.  Lab work was also normal.  No evidence of urinary tract infection.  Based on the amount of pain she was having and the fact that there was a small cyst on her left ovary, patient underwent ultrasound to evaluate for torsion.  No evidence of torsion present on exam.  She is feeling  much better after analgesia.  Patient will be discharged with analgesia and prompt follow-up with OB/GYN.  Final Clinical Impressions(s) / ED Diagnoses   Final diagnoses:  Pelvic pain  Cyst of left ovary    ED Discharge Orders         Ordered    ibuprofen (ADVIL) 800 MG tablet  Every 6 hours PRN     11/29/18 0355    HYDROcodone-acetaminophen (NORCO/VICODIN) 5-325 MG tablet  Every 4 hours PRN     11/29/18 0355           Orpah Greek, MD 11/29/18 [phone]

## 2018-11-29 ENCOUNTER — Emergency Department (HOSPITAL_COMMUNITY): Payer: 59

## 2018-11-29 DIAGNOSIS — N83202 Unspecified ovarian cyst, left side: Secondary | ICD-10-CM | POA: Diagnosis not present

## 2018-11-29 DIAGNOSIS — F17210 Nicotine dependence, cigarettes, uncomplicated: Secondary | ICD-10-CM | POA: Diagnosis not present

## 2018-11-29 DIAGNOSIS — N189 Chronic kidney disease, unspecified: Secondary | ICD-10-CM | POA: Diagnosis not present

## 2018-11-29 DIAGNOSIS — R109 Unspecified abdominal pain: Secondary | ICD-10-CM | POA: Diagnosis not present

## 2018-11-29 DIAGNOSIS — R102 Pelvic and perineal pain: Secondary | ICD-10-CM | POA: Diagnosis not present

## 2018-11-29 LAB — CBC WITH DIFFERENTIAL/PLATELET
Abs Immature Granulocytes: 0.05 10*3/uL (ref 0.00–0.07)
Basophils Absolute: 0 10*3/uL (ref 0.0–0.1)
Basophils Relative: 0 %
Eosinophils Absolute: 0.2 10*3/uL (ref 0.0–0.5)
Eosinophils Relative: 1 %
HCT: 40.6 % (ref 36.0–46.0)
Hemoglobin: 12.9 g/dL (ref 12.0–15.0)
Immature Granulocytes: 0 %
Lymphocytes Relative: 25 %
Lymphs Abs: 3.4 10*3/uL (ref 0.7–4.0)
MCH: 29.5 pg (ref 26.0–34.0)
MCHC: 31.8 g/dL (ref 30.0–36.0)
MCV: 92.9 fL (ref 80.0–100.0)
Monocytes Absolute: 0.8 10*3/uL (ref 0.1–1.0)
Monocytes Relative: 6 %
Neutro Abs: 9.1 10*3/uL — ABNORMAL HIGH (ref 1.7–7.7)
Neutrophils Relative %: 68 %
Platelets: 310 10*3/uL (ref 150–400)
RBC: 4.37 MIL/uL (ref 3.87–5.11)
RDW: 13.1 % (ref 11.5–15.5)
WBC: 13.5 10*3/uL — ABNORMAL HIGH (ref 4.0–10.5)
nRBC: 0 % (ref 0.0–0.2)

## 2018-11-29 LAB — BASIC METABOLIC PANEL
Anion gap: 10 (ref 5–15)
BUN: 16 mg/dL (ref 6–20)
CO2: 21 mmol/L — ABNORMAL LOW (ref 22–32)
Calcium: 9.1 mg/dL (ref 8.9–10.3)
Chloride: 110 mmol/L (ref 98–111)
Creatinine, Ser: 0.88 mg/dL (ref 0.44–1.00)
GFR calc Af Amer: >60 mL/min (ref >60)
GFR calc non Af Amer: >60 mL/min (ref >60)
Glucose, Bld: 105 mg/dL — ABNORMAL HIGH (ref 70–99)
Potassium: 3.7 mmol/L (ref 3.5–5.1)
Sodium: 141 mmol/L (ref 135–145)

## 2018-11-29 LAB — URINALYSIS, ROUTINE W REFLEX MICROSCOPIC
Bilirubin Urine: NEGATIVE
Glucose, UA: NEGATIVE mg/dL
Ketones, ur: 20 mg/dL — AB
Leukocytes,Ua: NEGATIVE
Nitrite: NEGATIVE
Protein, ur: NEGATIVE mg/dL
Specific Gravity, Urine: 1.011 (ref 1.005–1.030)
pH: 5 (ref 5.0–8.0)

## 2018-11-29 LAB — WET PREP, GENITAL
Clue Cells Wet Prep HPF POC: NONE SEEN
Sperm: NONE SEEN
Trich, Wet Prep: NONE SEEN
Yeast Wet Prep HPF POC: NONE SEEN

## 2018-11-29 LAB — I-STAT BETA HCG BLOOD, ED (MC, WL, AP ONLY): I-stat hCG, quantitative: <5 m[IU]/mL (ref <5)

## 2018-11-29 MED ORDER — IBUPROFEN 800 MG PO TABS: 800.0000 mg | ORAL_TABLET | Freq: Four times a day (QID) | ORAL | 0 refills | Status: DC | PRN

## 2018-11-29 MED ORDER — HYDROCODONE-ACETAMINOPHEN 5-325 MG PO TABS: 1.0000 | ORAL_TABLET | ORAL | 0 refills | Status: DC | PRN

## 2018-11-29 NOTE — ED Notes (Signed)
Pelvic cart at bedside, pt undressed from waist down

## 2018-12-03 LAB — GC/CHLAMYDIA PROBE AMP (~~LOC~~) NOT AT ARMC
Chlamydia: NEGATIVE
Neisseria Gonorrhea: NEGATIVE

## 2018-12-03 MED FILL — Hydrocodone-Acetaminophen Tab 5-325 MG: ORAL | Qty: 6 | Status: AC

## 2019-01-28 DIAGNOSIS — H3589 Other specified retinal disorders: Secondary | ICD-10-CM | POA: Diagnosis not present

## 2019-01-28 DIAGNOSIS — H5213 Myopia, bilateral: Secondary | ICD-10-CM | POA: Diagnosis not present

## 2019-02-25 ENCOUNTER — Other Ambulatory Visit: Payer: Self-pay

## 2019-02-25 DIAGNOSIS — Z20822 Contact with and (suspected) exposure to covid-19: Secondary | ICD-10-CM

## 2019-02-25 DIAGNOSIS — R6889 Other general symptoms and signs: Secondary | ICD-10-CM | POA: Diagnosis not present

## 2019-02-26 DIAGNOSIS — Z3042 Encounter for surveillance of injectable contraceptive: Secondary | ICD-10-CM | POA: Diagnosis not present

## 2019-02-26 LAB — NOVEL CORONAVIRUS, NAA: SARS-CoV-2, NAA: NOT DETECTED

## 2019-05-21 DIAGNOSIS — Z3042 Encounter for surveillance of injectable contraceptive: Secondary | ICD-10-CM | POA: Diagnosis not present

## 2019-08-21 DIAGNOSIS — Z3042 Encounter for surveillance of injectable contraceptive: Secondary | ICD-10-CM | POA: Diagnosis not present

## 2019-08-21 DIAGNOSIS — Z3202 Encounter for pregnancy test, result negative: Secondary | ICD-10-CM | POA: Diagnosis not present

## 2019-12-12 DIAGNOSIS — Z3202 Encounter for pregnancy test, result negative: Secondary | ICD-10-CM | POA: Diagnosis not present

## 2019-12-12 DIAGNOSIS — Z3042 Encounter for surveillance of injectable contraceptive: Secondary | ICD-10-CM | POA: Diagnosis not present

## 2020-02-26 DIAGNOSIS — Z01419 Encounter for gynecological examination (general) (routine) without abnormal findings: Secondary | ICD-10-CM | POA: Diagnosis not present

## 2020-02-26 DIAGNOSIS — Z8041 Family history of malignant neoplasm of ovary: Secondary | ICD-10-CM | POA: Diagnosis not present

## 2020-02-26 DIAGNOSIS — Z3042 Encounter for surveillance of injectable contraceptive: Secondary | ICD-10-CM | POA: Diagnosis not present

## 2020-03-02 DIAGNOSIS — Z3042 Encounter for surveillance of injectable contraceptive: Secondary | ICD-10-CM | POA: Diagnosis not present

## 2020-06-18 DIAGNOSIS — Z3202 Encounter for pregnancy test, result negative: Secondary | ICD-10-CM | POA: Diagnosis not present

## 2020-06-18 DIAGNOSIS — Z3042 Encounter for surveillance of injectable contraceptive: Secondary | ICD-10-CM | POA: Diagnosis not present

## 2020-08-08 ENCOUNTER — Other Ambulatory Visit: Payer: Self-pay

## 2020-08-08 ENCOUNTER — Encounter: Payer: Self-pay | Admitting: Emergency Medicine

## 2020-08-08 ENCOUNTER — Ambulatory Visit
Admission: EM | Admit: 2020-08-08 | Discharge: 2020-08-08 | Disposition: A | Discharge status: Home / Self Care | Payer: 59 | Attending: Emergency Medicine | Admitting: Emergency Medicine

## 2020-08-08 DIAGNOSIS — L237 Allergic contact dermatitis due to plants, except food: Secondary | ICD-10-CM | POA: Diagnosis not present

## 2020-08-08 MED ORDER — CETIRIZINE HCL 10 MG PO TABS: 10.0000 mg | ORAL_TABLET | Freq: Every day | ORAL | 1 refills | Status: DC

## 2020-08-08 MED ORDER — DEXAMETHASONE SODIUM PHOSPHATE 10 MG/ML IJ SOLN
10.0000 mg | Freq: Once | INTRAMUSCULAR | Status: AC
  Administered 2020-08-08: 10 mg via INTRAMUSCULAR

## 2020-08-08 MED ORDER — BENZONATATE 100 MG PO CAPS: 100.0000 mg | ORAL_CAPSULE | Freq: Three times a day (TID) | ORAL | 0 refills | Status: DC | PRN

## 2020-08-08 MED ORDER — PREDNISONE 10 MG PO TABS: 20.0000 mg | ORAL_TABLET | Freq: Every day | ORAL | 0 refills | Status: DC

## 2020-08-08 NOTE — Discharge Instructions (Addendum)
Decadron IM was given in office Prescribed zyrtec and prednisone Take as prescribed and to completion Limit hot shower and baths, or bathe with warm water.   Moisturize skin daily Follow up with PCP if symptoms persists Return or go to the ER if you have any new or worsening

## 2020-08-08 NOTE — ED Provider Notes (Signed)
[facility] CARE CENTER   [id number] 08/08/20 Arrival Time: 0939   Chief Complaint  Patient presents with  . Rash     SUBJECTIVE: History from: patient.  Wendy Powell is a 40 y.o. female presented to the urgent care for complaint of poison oak rash that started this past Monday.  She denies changes in soaps, detergents, or anyone with similar symptoms.  Localized the rash to her bilateral arm and abdomen.  She describes it as red and itchy.  She has tried OTC medication without relief.  Denies alleviating or aggravating factor.  She reports similar symptoms in the past.  Denies chills, fever, nausea, vomiting, diarrhea.    ROS: As per HPI.  All other pertinent ROS negative.      Past Medical History:  Diagnosis Date  . Chronic kidney disease    kidney stone   Past Surgical History:  Procedure Laterality Date  . CESAREAN SECTION     No Known Allergies No current facility-administered medications on file prior to encounter.   Current Outpatient Medications on File Prior to Encounter  Medication Sig Dispense Refill  . HYDROcodone-acetaminophen (NORCO/VICODIN) 5-325 MG tablet Take 1-2 tablets by mouth every 4 (four) hours as needed. 6 tablet 0  . ibuprofen (ADVIL) 800 MG tablet Take 1 tablet (800 mg total) by mouth every 6 (six) hours as needed for moderate pain. 20 tablet 0  . medroxyPROGESTERone (DEPO-PROVERA) 150 MG/ML injection Inject 150 mg into the muscle every 3 (three) months.      Social History   Socioeconomic History  . Marital status: Single    Spouse name: Not on file  . Number of children: Not on file  . Years of education: Not on file  . Highest education level: Not on file  Occupational History  . Not on file  Tobacco Use  . Smoking status: Current Every Day Smoker    Packs/day: 0.50    Years: 17.00    Pack years: 8.50    Types: Cigarettes  . Smokeless tobacco: Never Used  Substance and Sexual Activity  . Alcohol use: Yes    Comment: occ  . Drug  use: No  . Sexual activity: Not on file  Other Topics Concern  . Not on file  Social History Narrative  . Not on file   Social Determinants of Health   Financial Resource Strain: Not on file  Food Insecurity: Not on file  Transportation Needs: Not on file  Physical Activity: Not on file  Stress: Not on file  Social Connections: Not on file  Intimate Partner Violence: Not on file   No family history on file.  OBJECTIVE:  Vitals:   08/08/20 0955  BP: 122/82  Pulse: 84  Resp: 18  Temp: 98.3 F (36.8 C)  TempSrc: Oral  SpO2: 97%     Physical Exam Vitals and nursing note reviewed.  Constitutional:      General: She is not in acute distress.    Appearance: Normal appearance. She is normal weight. She is not ill-appearing, toxic-appearing or diaphoretic.  HENT:     Head: Normocephalic.  Cardiovascular:     Rate and Rhythm: Normal rate and regular rhythm.     Pulses: Normal pulses.     Heart sounds: Normal heart sounds. No murmur heard. No friction rub. No gallop.   Pulmonary:     Effort: Pulmonary effort is normal. No respiratory distress.     Breath sounds: Normal breath sounds. No stridor. No wheezing, rhonchi or  rales.  Chest:     Chest wall: No tenderness.  Skin:    Findings: Rash present. Rash is macular.  Neurological:     Mental Status: She is alert and oriented to person, place, and time.      LABS:  No results found for this or any previous visit (from the past 24 hour(s)).   ASSESSMENT & PLAN:  1. Poison oak dermatitis     Meds ordered this encounter  Medications  . dexamethasone (DECADRON) injection 10 mg  . cetirizine (ZYRTEC ALLERGY) 10 MG tablet    Sig: Take 1 tablet (10 mg total) by mouth daily.    Dispense:  30 tablet    Refill:  1  . predniSONE (DELTASONE) 10 MG tablet    Sig: Take 2 tablets (20 mg total) by mouth daily.    Dispense:  15 tablet    Refill:  0    Discharge Instructions  Decadron IM was given in  office Prescribed zyrtec and prednisone Take as prescribed and to completion Limit hot shower and baths, or bathe with warm water.   Moisturize skin daily Follow up with PCP if symptoms persists Return or go to the ER if you have any new or worsening symptoms  Reviewed expectations re: course of current medical issues. Questions answered. Outlined signs and symptoms indicating need for more acute intervention. Patient verbalized understanding. After Visit Summary given.         Durward Parcel, FNP 08/08/20 1011

## 2020-08-08 NOTE — ED Triage Notes (Signed)
Poison oak rash on bilateral arms and stomach that started on Monday

## 2020-09-05 ENCOUNTER — Encounter (HOSPITAL_COMMUNITY): Payer: Self-pay | Admitting: Emergency Medicine

## 2020-09-05 ENCOUNTER — Emergency Department (HOSPITAL_COMMUNITY)
Admission: EM | Admit: 2020-09-05 | Discharge: 2020-09-05 | Disposition: A | Discharge status: Home / Self Care | Payer: 59 | Attending: Emergency Medicine | Admitting: Emergency Medicine

## 2020-09-05 ENCOUNTER — Emergency Department (HOSPITAL_COMMUNITY): Payer: 59

## 2020-09-05 ENCOUNTER — Other Ambulatory Visit: Payer: Self-pay

## 2020-09-05 DIAGNOSIS — N189 Chronic kidney disease, unspecified: Secondary | ICD-10-CM | POA: Insufficient documentation

## 2020-09-05 DIAGNOSIS — R109 Unspecified abdominal pain: Secondary | ICD-10-CM | POA: Insufficient documentation

## 2020-09-05 DIAGNOSIS — M5441 Lumbago with sciatica, right side: Secondary | ICD-10-CM | POA: Insufficient documentation

## 2020-09-05 DIAGNOSIS — F17210 Nicotine dependence, cigarettes, uncomplicated: Secondary | ICD-10-CM | POA: Insufficient documentation

## 2020-09-05 DIAGNOSIS — M549 Dorsalgia, unspecified: Secondary | ICD-10-CM

## 2020-09-05 DIAGNOSIS — M545 Low back pain, unspecified: Secondary | ICD-10-CM | POA: Diagnosis not present

## 2020-09-05 DIAGNOSIS — M5431 Sciatica, right side: Secondary | ICD-10-CM

## 2020-09-05 LAB — COMPREHENSIVE METABOLIC PANEL
ALT: 19 U/L (ref 0–44)
AST: 19 U/L (ref 15–41)
Albumin: 4.3 g/dL (ref 3.5–5.0)
Alkaline Phosphatase: 62 U/L (ref 38–126)
Anion gap: 8 (ref 5–15)
BUN: 17 mg/dL (ref 6–20)
CO2: 19 mmol/L — ABNORMAL LOW (ref 22–32)
Calcium: 9 mg/dL (ref 8.9–10.3)
Chloride: 112 mmol/L — ABNORMAL HIGH (ref 98–111)
Creatinine, Ser: 0.81 mg/dL (ref 0.44–1.00)
GFR, Estimated: >60 mL/min (ref >60)
Glucose, Bld: 117 mg/dL — ABNORMAL HIGH (ref 70–99)
Potassium: 4.2 mmol/L (ref 3.5–5.1)
Sodium: 139 mmol/L (ref 135–145)
Total Bilirubin: 0.5 mg/dL (ref 0.3–1.2)
Total Protein: 7.2 g/dL (ref 6.5–8.1)

## 2020-09-05 LAB — URINALYSIS, ROUTINE W REFLEX MICROSCOPIC
Bilirubin Urine: NEGATIVE
Glucose, UA: NEGATIVE mg/dL
Ketones, ur: NEGATIVE mg/dL
Leukocytes,Ua: NEGATIVE
Nitrite: NEGATIVE
Protein, ur: NEGATIVE mg/dL
Specific Gravity, Urine: 1.013 (ref 1.005–1.030)
pH: 5 (ref 5.0–8.0)

## 2020-09-05 LAB — CBC WITH DIFFERENTIAL/PLATELET
Abs Immature Granulocytes: 0.03 K/uL (ref 0.00–0.07)
Basophils Absolute: 0 K/uL (ref 0.0–0.1)
Basophils Relative: 0 %
Eosinophils Absolute: 0.1 K/uL (ref 0.0–0.5)
Eosinophils Relative: 1 %
HCT: 40.8 % (ref 36.0–46.0)
Hemoglobin: 13.6 g/dL (ref 12.0–15.0)
Immature Granulocytes: 0 %
Lymphocytes Relative: 22 %
Lymphs Abs: 2.3 K/uL (ref 0.7–4.0)
MCH: 31.3 pg (ref 26.0–34.0)
MCHC: 33.3 g/dL (ref 30.0–36.0)
MCV: 93.8 fL (ref 80.0–100.0)
Monocytes Absolute: 0.5 K/uL (ref 0.1–1.0)
Monocytes Relative: 5 %
Neutro Abs: 7.3 K/uL (ref 1.7–7.7)
Neutrophils Relative %: 72 %
Platelets: 341 K/uL (ref 150–400)
RBC: 4.35 MIL/uL (ref 3.87–5.11)
RDW: 14.1 % (ref 11.5–15.5)
WBC: 10.3 K/uL (ref 4.0–10.5)
nRBC: 0 % (ref 0.0–0.2)

## 2020-09-05 LAB — I-STAT BETA HCG BLOOD, ED (MC, WL, AP ONLY): I-stat hCG, quantitative: <5 m[IU]/mL (ref <5)

## 2020-09-05 MED ORDER — METHOCARBAMOL 500 MG PO TABS: 500.0000 mg | ORAL_TABLET | Freq: Two times a day (BID) | ORAL | 0 refills | Status: DC

## 2020-09-05 MED ORDER — KETOROLAC TROMETHAMINE 60 MG/2ML IM SOLN
60.0000 mg | Freq: Once | INTRAMUSCULAR | Status: AC
  Administered 2020-09-05: 60 mg via INTRAMUSCULAR
  Filled 2020-09-05: qty 2

## 2020-09-05 MED ORDER — PREDNISONE 20 MG PO TABS: ORAL_TABLET | ORAL | 0 refills | Status: DC

## 2020-09-05 MED ORDER — NAPROXEN 500 MG PO TABS: 500.0000 mg | ORAL_TABLET | Freq: Two times a day (BID) | ORAL | 0 refills | Status: DC

## 2020-09-05 MED ORDER — ONDANSETRON 8 MG PO TBDP
8.0000 mg | ORAL_TABLET | Freq: Once | ORAL | Status: AC
  Administered 2020-09-05: 8 mg via ORAL
  Filled 2020-09-05: qty 1

## 2020-09-05 MED ORDER — OXYCODONE-ACETAMINOPHEN 5-325 MG PO TABS
2.0000 | ORAL_TABLET | Freq: Once | ORAL | Status: AC
  Administered 2020-09-05: 2 via ORAL
  Filled 2020-09-05: qty 2

## 2020-09-05 NOTE — ED Provider Notes (Signed)
[facility] EMERGENCY DEPARTMENT Provider Note   CSN: [id number] Arrival date & time: 09/05/20  0031     History Chief Complaint  Patient presents with  . Flank Pain    Wendy Powell is a 40 y.o. female.  History of kidney stones and pain feels somewhat similar to that but in a different location.  She states that she has right-sided pain.  Its just at her lower back near the gluteus area.  It radiates down and around her leg towards her inner knee.  That sharp in nature.  Worse with certain movements better with rest.  Never anything at this before besides the kidney stone with a kidney stone was higher.  She is never had sciatica that she knows of.  No other neurologic issues.  Has no incontinence, weakness, sensation changes.  No recent trauma.  No recent illnesses.   Flank Pain       Past Medical History:  Diagnosis Date  . Chronic kidney disease    kidney stone    There are no problems to display for this patient.   Past Surgical History:  Procedure Laterality Date  . CESAREAN SECTION       OB History   No obstetric history on file.     History reviewed. No pertinent family history.  Social History   Tobacco Use  . Smoking status: Current Every Day Smoker    Packs/day: 0.50    Years: 17.00    Pack years: 8.50    Types: Cigarettes  . Smokeless tobacco: Never Used  Substance Use Topics  . Alcohol use: Yes    Comment: occ  . Drug use: No    Home Medications Prior to Admission medications   Medication Sig Start Date End Date Taking? Authorizing Provider  methocarbamol (ROBAXIN) 500 MG tablet Take 1 tablet (500 mg total) by mouth 2 (two) times daily. 09/05/20  Yes Bran Aldridge, Barbara Cower, MD  naproxen (NAPROSYN) 500 MG tablet Take 1 tablet (500 mg total) by mouth 2 (two) times daily. 09/05/20  Yes Anelisse Jacobson, Barbara Cower, MD  predniSONE (DELTASONE) 20 MG tablet 3 tabs po daily x 3 days, then 2 tabs x 3 days, then 1.5 tabs x 3 days, then 1 tab x 3 days, then 0.5 tabs x 3 days  09/05/20  Yes Aaira Oestreicher, Barbara Cower, MD  benzonatate (TESSALON) 100 MG capsule Take 1 capsule (100 mg total) by mouth 3 (three) times daily as needed for cough. 08/08/20   Avegno, Zachery Dakins, FNP  cetirizine (ZYRTEC ALLERGY) 10 MG tablet Take 1 tablet (10 mg total) by mouth daily. 08/08/20   Avegno, Zachery Dakins, FNP  medroxyPROGESTERone (DEPO-PROVERA) 150 MG/ML injection Inject 150 mg into the muscle every 3 (three) months.  11/27/18 09/05/20  [provider]    Allergies    Patient has no known allergies.  Review of Systems   Review of Systems  Genitourinary: Positive for flank pain.  All other systems reviewed and are negative.   Physical Exam Updated Vital Signs BP 130/82 (BP Location: Left Arm)   Pulse 72   Temp 98.5 F (36.9 C) (Oral)   Resp 16   Ht 5\' 3"  (1.6 m)   Wt 71.7 kg   SpO2 98%   BMI 27.99 kg/m   Physical Exam Vitals and nursing note reviewed.  Constitutional:      Appearance: She is well-developed.  HENT:     Head: Normocephalic and atraumatic.     Mouth/Throat:     Mouth: Mucous  membranes are moist.     Pharynx: Oropharynx is clear.  Eyes:     Pupils: Pupils are equal, round, and reactive to light.  Cardiovascular:     Rate and Rhythm: Normal rate and regular rhythm.     Pulses: Normal pulses.  Pulmonary:     Effort: No respiratory distress.     Breath sounds: No stridor.  Abdominal:     General: Abdomen is flat. There is no distension.  Musculoskeletal:        General: No swelling or tenderness. Normal range of motion.     Cervical back: Normal range of motion.  Skin:    General: Skin is warm and dry.  Neurological:     General: No focal deficit present.     Mental Status: She is alert.     ED Results / Procedures / Treatments   Labs (all labs ordered are listed, but only abnormal results are displayed) Labs Reviewed  URINALYSIS, ROUTINE W REFLEX MICROSCOPIC - Abnormal; Notable for the following components:      Result Value   Color, Urine  STRAW (*)    Hgb urine dipstick SMALL (*)    Bacteria, UA RARE (*)    All other components within normal limits  COMPREHENSIVE METABOLIC PANEL - Abnormal; Notable for the following components:   Chloride 112 (*)    CO2 19 (*)    Glucose, Bld 117 (*)    All other components within normal limits  CBC WITH DIFFERENTIAL/PLATELET  I-STAT BETA HCG BLOOD, ED ([facility], [facility], [facility] ONLY)    EKG None  Radiology CT L-SPINE NO CHARGE  Result Date: 09/05/2020 CLINICAL DATA:  Back pain. EXAM: CT LUMBAR SPINE WITHOUT CONTRAST TECHNIQUE: Reformats of the lumbar spine were requested an generated from a noncontrast abdomen and pelvis CT. COMPARISON:  None. FINDINGS: Segmentation: 5 lumbar type vertebrae. Alignment: Normal. Vertebrae: No acute fracture or focal pathologic process. Paraspinal and other soft tissues: Negative. Disc levels: No degenerative changes or visible impingement IMPRESSION: Normal lumbar spine reformats. Electronically Signed   By: Marnee Spring M.D.   On: 09/05/2020 05:12   CT Renal Stone Study  Result Date: 09/05/2020 CLINICAL DATA:  Flank and low back pain, question kidney stone EXAM: CT ABDOMEN AND PELVIS WITHOUT CONTRAST TECHNIQUE: Multidetector CT imaging of the abdomen and pelvis was performed following the standard protocol without IV contrast. COMPARISON:  11/29/2018 FINDINGS: Lower chest:  No contributory findings. Hepatobiliary: No focal liver abnormality.No evidence of biliary obstruction or stone. Pancreas: Unremarkable. Spleen: Unremarkable. Adrenals/Urinary Tract: Negative adrenals. No hydronephrosis or stone. Unremarkable bladder. Stomach/Bowel:  No obstruction. No appendicitis. Vascular/Lymphatic: No acute vascular abnormality. No mass or adenopathy. Reproductive:No pathologic findings. Other: No ascites or pneumoperitoneum. Musculoskeletal: No acute abnormalities. IMPRESSION: Negative abdominal CT. Electronically Signed   By: Marnee Spring M.D.   On: 09/05/2020 04:55     Procedures Procedures   Medications Ordered in ED Medications  ketorolac (TORADOL) injection 60 mg (60 mg Intramuscular Given 09/05/20 0302)  oxyCODONE-acetaminophen (PERCOCET/ROXICET) 5-325 MG per tablet 2 tablet (2 tablets Oral Given 09/05/20 0302)  ondansetron (ZOFRAN-ODT) disintegrating tablet 8 mg (8 mg Oral Given 09/05/20 0302)    ED Course  I have reviewed the triage vital signs and the nursing notes.  Pertinent labs & imaging results that were available during my care of the patient were reviewed by me and considered in my medical decision making (see chart for details).    MDM Rules/Calculators/A&P  Suspect sciatica. No e/o kidney stone or recent stone on ct scan. Pain improved with meds here. Stable for dc w/ supportive care for same.   Final Clinical Impression(s) / ED Diagnoses Final diagnoses:  Back pain  Sciatica of right side    Rx / DC Orders ED Discharge Orders         Ordered    naproxen (NAPROSYN) 500 MG tablet  2 times daily        09/05/20 0530    predniSONE (DELTASONE) 20 MG tablet        09/05/20 0530    methocarbamol (ROBAXIN) 500 MG tablet  2 times daily        09/05/20 0530           Demetrick Eichenberger, Barbara Cower, MD 09/05/20 [phone]

## 2020-09-05 NOTE — ED Triage Notes (Signed)
Pt with R flank pain that started when she was "about to go to bed". Pt also endorses nausea with this pain.

## 2020-09-05 NOTE — ED Notes (Signed)
ED Provider at bedside. 

## 2020-09-17 DIAGNOSIS — Z3042 Encounter for surveillance of injectable contraceptive: Secondary | ICD-10-CM | POA: Diagnosis not present

## 2020-10-19 DIAGNOSIS — H524 Presbyopia: Secondary | ICD-10-CM | POA: Diagnosis not present

## 2020-12-14 ENCOUNTER — Encounter (HOSPITAL_COMMUNITY): Payer: Self-pay

## 2020-12-14 ENCOUNTER — Other Ambulatory Visit: Payer: Self-pay

## 2020-12-14 ENCOUNTER — Emergency Department (HOSPITAL_COMMUNITY)
Admission: EM | Admit: 2020-12-14 | Discharge: 2020-12-14 | Disposition: A | Discharge status: Home / Self Care | Payer: 59 | Attending: Emergency Medicine | Admitting: Emergency Medicine

## 2020-12-14 DIAGNOSIS — F17210 Nicotine dependence, cigarettes, uncomplicated: Secondary | ICD-10-CM | POA: Insufficient documentation

## 2020-12-14 DIAGNOSIS — B0052 Herpesviral keratitis: Secondary | ICD-10-CM | POA: Diagnosis not present

## 2020-12-14 DIAGNOSIS — S058X2A Other injuries of left eye and orbit, initial encounter: Secondary | ICD-10-CM | POA: Diagnosis present

## 2020-12-14 DIAGNOSIS — X58XXXA Exposure to other specified factors, initial encounter: Secondary | ICD-10-CM | POA: Diagnosis not present

## 2020-12-14 DIAGNOSIS — N189 Chronic kidney disease, unspecified: Secondary | ICD-10-CM | POA: Insufficient documentation

## 2020-12-14 DIAGNOSIS — S0502XA Injury of conjunctiva and corneal abrasion without foreign body, left eye, initial encounter: Secondary | ICD-10-CM | POA: Insufficient documentation

## 2020-12-14 MED ORDER — FLUORESCEIN SODIUM 1 MG OP STRP
1.0000 | ORAL_STRIP | Freq: Once | OPHTHALMIC | Status: AC
  Administered 2020-12-14: 1 via OPHTHALMIC
  Filled 2020-12-14: qty 1

## 2020-12-14 MED ORDER — ERYTHROMYCIN 5 MG/GM OP OINT
TOPICAL_OINTMENT | Freq: Once | OPHTHALMIC | Status: AC
  Administered 2020-12-14: 1 via OPHTHALMIC
  Filled 2020-12-14: qty 3.5

## 2020-12-14 MED ORDER — TETRACAINE HCL 0.5 % OP SOLN
2.0000 [drp] | Freq: Once | OPHTHALMIC | Status: AC
  Administered 2020-12-14: 2 [drp] via OPHTHALMIC
  Filled 2020-12-14: qty 4

## 2020-12-14 MED ORDER — OXYCODONE-ACETAMINOPHEN 5-325 MG PO TABS
1.0000 | ORAL_TABLET | Freq: Once | ORAL | Status: AC
  Administered 2020-12-14: 1 via ORAL
  Filled 2020-12-14: qty 1

## 2020-12-14 NOTE — ED Triage Notes (Signed)
Pov from home with cc of left eye pain and swelling since 7am. States that it has slowly been getting worse. Blurry vision. Denies any new soaps/contacts. Denies injury

## 2020-12-14 NOTE — ED Notes (Signed)
Visual Acuity  L eye 20/25 R eye 20/13 Both eyes 20/13

## 2020-12-14 NOTE — ED Provider Notes (Signed)
[facility] EMERGENCY DEPARTMENT Provider Note   CSN: [id number] Arrival date & time: 12/14/20  0143     History Chief Complaint  Patient presents with   Eye Pain    Wendy Powell is a 40 y.o. female.  40 year old female states that she was woken up on Sunday morning with left eye pain.  She states that it seemed to be the top of her eye initially.  She states that throughout the day even after using eyedrops the pain seemed to migrate more towards her lower eye.  She is then started have some periorbital edema.  She thought that maybe this was a stye or chalzion so started warm compresses which also did not help much. Pain worsened, so came here for eval tonight. No fever. No recent illnesses.    Eye Pain      Past Medical History:  Diagnosis Date   Chronic kidney disease    kidney stone    There are no problems to display for this patient.   Past Surgical History:  Procedure Laterality Date   CESAREAN SECTION       OB History   No obstetric history on file.     History reviewed. No pertinent family history.  Social History   Tobacco Use   Smoking status: Every Day    Packs/day: 0.50    Years: 17.00    Pack years: 8.50    Types: Cigarettes   Smokeless tobacco: Never  Substance Use Topics   Alcohol use: Yes    Comment: occ   Drug use: No    Home Medications Prior to Admission medications   Medication Sig Start Date End Date Taking? Authorizing Provider  benzonatate (TESSALON) 100 MG capsule Take 1 capsule (100 mg total) by mouth 3 (three) times daily as needed for cough. 08/08/20   Avegno, Zachery Dakins, FNP  cetirizine (ZYRTEC ALLERGY) 10 MG tablet Take 1 tablet (10 mg total) by mouth daily. 08/08/20   Avegno, Zachery Dakins, FNP  methocarbamol (ROBAXIN) 500 MG tablet Take 1 tablet (500 mg total) by mouth 2 (two) times daily. 09/05/20   Rovena Hearld, Barbara Cower, MD  naproxen (NAPROSYN) 500 MG tablet Take 1 tablet (500 mg total) by mouth 2 (two) times daily. 09/05/20    Octavia Mottola, Barbara Cower, MD  predniSONE (DELTASONE) 20 MG tablet 3 tabs po daily x 3 days, then 2 tabs x 3 days, then 1.5 tabs x 3 days, then 1 tab x 3 days, then 0.5 tabs x 3 days 09/05/20   Deke Tilghman, Barbara Cower, MD  medroxyPROGESTERone (DEPO-PROVERA) 150 MG/ML injection Inject 150 mg into the muscle every 3 (three) months.  11/27/18 09/05/20  [provider]    Allergies    Patient has no known allergies.  Review of Systems   Review of Systems  Eyes:  Positive for pain.  All other systems reviewed and are negative.  Physical Exam Updated Vital Signs BP 119/87   Pulse 82   Temp 98.2 F (36.8 C)   Resp 17   Ht 5\' 3"  (1.6 m)   Wt 74.4 kg   SpO2 100%   BMI 29.05 kg/m   Physical Exam Vitals and nursing note reviewed.  Constitutional:      Appearance: She is well-developed.  HENT:     Head: Normocephalic and atraumatic.     Nose: Nose normal. No congestion or rhinorrhea.     Mouth/Throat:     Mouth: Mucous membranes are moist.     Pharynx: Oropharynx is clear.  Eyes:     General: No scleral icterus.       Right eye: No discharge.        Left eye: No discharge.     Extraocular Movements: Extraocular movements intact.     Right eye: Normal extraocular motion and no nystagmus.     Left eye: Normal extraocular motion and no nystagmus.     Conjunctiva/sclera:     Right eye: Right conjunctiva is not injected. No exudate or hemorrhage.    Left eye: Left conjunctiva is injected. No exudate or hemorrhage.    Pupils: Pupils are equal, round, and reactive to light.     Slit lamp exam:    Right eye: No photophobia.     Left eye: Photophobia present.      Comments: Woods lamp with small corneal abrasion.   IOP of 22 in left eye.   Complete resolution of pain in left eye when tetracaine placed.   Cardiovascular:     Rate and Rhythm: Normal rate and regular rhythm.  Pulmonary:     Effort: No respiratory distress.     Breath sounds: No stridor.  Abdominal:     General: There is no  distension.  Musculoskeletal:     Cervical back: Normal range of motion.  Neurological:     Mental Status: She is alert.    ED Results / Procedures / Treatments   Labs (all labs ordered are listed, but only abnormal results are displayed) Labs Reviewed - No data to display  EKG None  Radiology No results found.  Procedures Procedures   Medications Ordered in ED Medications  oxyCODONE-acetaminophen (PERCOCET/ROXICET) 5-325 MG per tablet 1 tablet (1 tablet Oral Given 12/14/20 0228)  fluorescein ophthalmic strip 1 strip (1 strip Left Eye Given 12/14/20 0229)  tetracaine (PONTOCAINE) 0.5 % ophthalmic solution 2 drop (2 drops Left Eye Given 12/14/20 0229)  erythromycin ophthalmic ointment (1 application Left Eye Given 12/14/20 0250)    ED Course  I have reviewed the triage vital signs and the nursing notes.  Pertinent labs & imaging results that were available during my care of the patient were reviewed by me and considered in my medical decision making (see chart for details).    MDM Rules/Calculators/A&P                         Patient with what appears to be corneal abrasion possibly at the area depicted in the diagram.  Will treat with erythromycin.  Her intraocular pressure was okay pupils were normal making glaucoma unlikely.  She had no erythema or significant periorbital tenderness to suggest septal or preseptal cellulitis.  There was no stye or chalzion on exam.  Will refer to ophthalmology as she did have some diminished vision in the left eye that was not corrected with her glasses which doesn't make perfect sense for an abrasion.   Final Clinical Impression(s) / ED Diagnoses Final diagnoses:  Abrasion of left cornea, initial encounter    Rx / DC Orders ED Discharge Orders     None        Ethen Bannan, Barbara Cower, MD 12/14/20 [phone]

## 2020-12-17 DIAGNOSIS — B0052 Herpesviral keratitis: Secondary | ICD-10-CM | POA: Diagnosis not present

## 2020-12-24 DIAGNOSIS — Z3202 Encounter for pregnancy test, result negative: Secondary | ICD-10-CM | POA: Diagnosis not present

## 2020-12-24 DIAGNOSIS — Z3042 Encounter for surveillance of injectable contraceptive: Secondary | ICD-10-CM | POA: Diagnosis not present

## 2021-01-04 DIAGNOSIS — B0052 Herpesviral keratitis: Secondary | ICD-10-CM | POA: Diagnosis not present

## 2021-01-28 ENCOUNTER — Other Ambulatory Visit (HOSPITAL_COMMUNITY): Payer: Self-pay | Admitting: Family Medicine

## 2021-01-28 DIAGNOSIS — Z1231 Encounter for screening mammogram for malignant neoplasm of breast: Secondary | ICD-10-CM

## 2021-02-10 ENCOUNTER — Ambulatory Visit (HOSPITAL_COMMUNITY)
Admission: RE | Admit: 2021-02-10 | Discharge: 2021-02-10 | Disposition: A | Discharge status: Home / Self Care | Payer: 59 | Source: Ambulatory Visit | Attending: Family Medicine | Admitting: Family Medicine

## 2021-02-10 ENCOUNTER — Other Ambulatory Visit: Payer: Self-pay

## 2021-02-10 DIAGNOSIS — Z1231 Encounter for screening mammogram for malignant neoplasm of breast: Secondary | ICD-10-CM | POA: Insufficient documentation

## 2021-03-04 DIAGNOSIS — B0052 Herpesviral keratitis: Secondary | ICD-10-CM | POA: Diagnosis not present

## 2021-03-18 DIAGNOSIS — Z3042 Encounter for surveillance of injectable contraceptive: Secondary | ICD-10-CM | POA: Diagnosis not present

## 2021-05-25 ENCOUNTER — Other Ambulatory Visit: Payer: Self-pay | Admitting: Orthopedic Surgery

## 2021-05-25 DIAGNOSIS — J111 Influenza due to unidentified influenza virus with other respiratory manifestations: Secondary | ICD-10-CM

## 2021-05-25 MED ORDER — OSELTAMIVIR PHOSPHATE 75 MG PO CAPS: 75.0000 mg | ORAL_CAPSULE | Freq: Two times a day (BID) | ORAL | 0 refills | Status: AC

## 2021-05-25 NOTE — Progress Notes (Signed)
Meds ordered this encounter  °Medications  ° oseltamivir (TAMIFLU) 75 MG capsule  °  Sig: Take 1 capsule (75 mg total) by mouth 2 (two) times daily for 5 days.  °  Dispense:  10 capsule  °  Refill:  0  °  °

## 2021-06-09 ENCOUNTER — Other Ambulatory Visit: Payer: Self-pay | Admitting: Orthopedic Surgery

## 2021-06-09 DIAGNOSIS — J069 Acute upper respiratory infection, unspecified: Secondary | ICD-10-CM

## 2021-06-09 MED ORDER — AZITHROMYCIN 250 MG PO TABS: ORAL_TABLET | ORAL | 0 refills | Status: DC

## 2021-06-09 NOTE — Progress Notes (Signed)
Treated for flu like symptoms with tamiflu  Initially improved but seems to be having recurrent URI   Meds ordered this encounter  Medications   azithromycin (ZITHROMAX Z-PAK) 250 MG tablet    Sig: Take 2 day one then daily until finished    Dispense:  6 each    Refill:  0

## 2021-06-17 DIAGNOSIS — Z3042 Encounter for surveillance of injectable contraceptive: Secondary | ICD-10-CM | POA: Diagnosis not present

## 2021-07-05 DIAGNOSIS — Z01419 Encounter for gynecological examination (general) (routine) without abnormal findings: Secondary | ICD-10-CM | POA: Diagnosis not present

## 2021-09-10 ENCOUNTER — Other Ambulatory Visit (INDEPENDENT_AMBULATORY_CARE_PROVIDER_SITE_OTHER): Payer: Self-pay | Admitting: General Surgery

## 2021-09-10 DIAGNOSIS — J069 Acute upper respiratory infection, unspecified: Secondary | ICD-10-CM

## 2021-09-10 MED ORDER — AZITHROMYCIN 250 MG PO TABS: ORAL_TABLET | ORAL | 0 refills | Status: DC

## 2021-09-10 NOTE — Progress Notes (Signed)
Sinusitis

## 2021-09-20 DIAGNOSIS — Z3042 Encounter for surveillance of injectable contraceptive: Secondary | ICD-10-CM | POA: Diagnosis not present

## 2021-09-20 DIAGNOSIS — Z3202 Encounter for pregnancy test, result negative: Secondary | ICD-10-CM | POA: Diagnosis not present

## 2021-12-10 DIAGNOSIS — H5213 Myopia, bilateral: Secondary | ICD-10-CM | POA: Diagnosis not present

## 2021-12-29 DIAGNOSIS — Z3042 Encounter for surveillance of injectable contraceptive: Secondary | ICD-10-CM | POA: Diagnosis not present

## 2021-12-29 DIAGNOSIS — Z3202 Encounter for pregnancy test, result negative: Secondary | ICD-10-CM | POA: Diagnosis not present

## 2022-04-26 ENCOUNTER — Other Ambulatory Visit (HOSPITAL_COMMUNITY): Payer: Self-pay

## 2022-04-27 ENCOUNTER — Other Ambulatory Visit (HOSPITAL_COMMUNITY): Payer: Self-pay | Admitting: Unknown Physician Specialty

## 2022-04-27 DIAGNOSIS — Z1231 Encounter for screening mammogram for malignant neoplasm of breast: Secondary | ICD-10-CM

## 2022-05-02 ENCOUNTER — Other Ambulatory Visit (HOSPITAL_COMMUNITY): Payer: Self-pay

## 2022-05-02 MED ORDER — MEDROXYPROGESTERONE ACETATE 150 MG/ML IM SUSY
PREFILLED_SYRINGE | INTRAMUSCULAR | 1 refills | Status: DC
  Filled 2022-05-02: qty 1, 90d supply, fill #0

## 2022-05-04 DIAGNOSIS — Z3202 Encounter for pregnancy test, result negative: Secondary | ICD-10-CM | POA: Diagnosis not present

## 2022-05-04 DIAGNOSIS — Z3042 Encounter for surveillance of injectable contraceptive: Secondary | ICD-10-CM | POA: Diagnosis not present

## 2022-05-05 ENCOUNTER — Ambulatory Visit (HOSPITAL_COMMUNITY)
Admission: RE | Admit: 2022-05-05 | Discharge: 2022-05-05 | Disposition: A | Discharge status: Home / Self Care | Payer: 59 | Source: Ambulatory Visit | Attending: Unknown Physician Specialty | Admitting: Unknown Physician Specialty

## 2022-05-05 DIAGNOSIS — Z1231 Encounter for screening mammogram for malignant neoplasm of breast: Secondary | ICD-10-CM | POA: Insufficient documentation

## 2022-05-06 ENCOUNTER — Ambulatory Visit: Payer: 59 | Admitting: Obstetrics & Gynecology

## 2022-05-06 ENCOUNTER — Other Ambulatory Visit: Payer: Self-pay | Admitting: Obstetrics & Gynecology

## 2022-05-06 ENCOUNTER — Other Ambulatory Visit (INDEPENDENT_AMBULATORY_CARE_PROVIDER_SITE_OTHER): Payer: 59

## 2022-05-06 ENCOUNTER — Encounter: Payer: Self-pay | Admitting: Obstetrics & Gynecology

## 2022-05-06 VITALS — BP 128/87 | HR 80 | Ht 63.0 in | Wt 177.0 lb

## 2022-05-06 DIAGNOSIS — R1032 Left lower quadrant pain: Secondary | ICD-10-CM

## 2022-05-06 DIAGNOSIS — R102 Pelvic and perineal pain: Secondary | ICD-10-CM | POA: Diagnosis not present

## 2022-05-06 DIAGNOSIS — Z01818 Encounter for other preprocedural examination: Secondary | ICD-10-CM

## 2022-05-06 DIAGNOSIS — G8929 Other chronic pain: Secondary | ICD-10-CM

## 2022-05-06 MED ORDER — KETOROLAC TROMETHAMINE 10 MG PO TABS: 10.0000 mg | ORAL_TABLET | Freq: Three times a day (TID) | ORAL | 0 refills | Status: DC | PRN

## 2022-05-06 MED ORDER — HYDROCODONE-ACETAMINOPHEN 5-325 MG PO TABS: 1.0000 | ORAL_TABLET | Freq: Four times a day (QID) | ORAL | 0 refills | Status: DC | PRN

## 2022-05-06 NOTE — Progress Notes (Signed)
PELVIC US TA/TV: homogeneous anteverted uterus,WNL,EEC 7.7 mm,normal left ovary,two simple right ovarian cysts (#1) 2.1 x 1.9 x 1.8 cm,(#2) 2 x 1.6 x 1.7 cm,unable to slide left ovary,positive sliding sign right ovary,bilateral prominent adnexal vessels,no free fluid,left adnexal pain during ultrasound  Chaperone Whitney

## 2022-05-06 NOTE — Patient Instructions (Signed)
Wendy Powell  05/06/2022     @PREFPERIOPPHARMACY @   Your procedure is scheduled on  05/11/2022.   Report to 05/13/2022 at  1130  A.M.   Call this number if you have problems the morning of surgery:  [phone]  If you experience any cold or flu symptoms such as cough, fever, chills, shortness of breath, etc. between now and your scheduled surgery, please notify [phone] at the above number.   Remember:  Do not eat after midnight.   You may drink clear liquids until 0930 am on 05/11/2022.           Clear liquids allowed are:                    Water, Juice (No red color; non-citric and without pulp; diabetics please choose diet or no sugar options), Carbonated beverages (diabetics please choose diet or no sugar options), Clear Tea (No creamer, milk, or cream, including half & half and powdered creamer), Black Coffee Only (No creamer, milk or cream, including half & half and powdered creamer), Plain Jell-O Only (No red color; diabetics please choose no sugar options), Clear Sports drink (No red color; diabetics please choose diet or no sugar options), and Plain Popsicles Only (No red color; diabetics please choose no sugar options)     Take these medicines the morning of surgery with A SIP OF WATER               hydrocodone or tramadol (if needed).     Do not wear jewelry, make-up or nail polish.  Do not wear lotions, powders, or perfumes, or deodorant.  Do not shave 48 hours prior to surgery.  Men may shave face and neck.  Do not bring valuables to the hospital.  [facility] is not responsible for any belongings or valuables.  Contacts, dentures or bridgework may not be worn into surgery.  Leave your suitcase in the car.  After surgery it may be brought to your room.  For patients admitted to the hospital, discharge time will be determined by your treatment team.  Patients discharged the day of surgery will not be allowed to drive home and must have someone with them  for 24 hours.    Special instructions:   DO NOT smoke tobacco or vape for 24 hours before your procedure.  Please read over the following fact sheets that you were given. Pain Booklet, Coughing and Deep Breathing, Surgical Site Infection Prevention, Anesthesia Post-op Instructions, and Care and Recovery After Surgery       Unilateral Salpingo-Oophorectomy, Care After The following information offers guidance on how to care for yourself after your procedure. Your health care provider may also give you more specific instructions. If you have problems or questions, contact your health care provider. What can I expect after the procedure? After the procedure, it is common to have: Pain in your abdomen. Occasional vaginal bleeding (spotting). Tiredness. Your recovery time will depend on which method was used for your surgery. Follow these instructions at home: Medicines Take over-the-counter and prescription medicines only as told by your health care provider. Ask your health care provider if the medicine prescribed to you: Requires you to avoid driving or using machinery. Can cause constipation. You may need to take these actions to prevent or treat constipation: Drink enough fluid to keep your urine pale yellow. Take over-the-counter or prescription medicines. Eat foods that are high in fiber, such  as beans, whole grains, and fresh fruits and vegetables. Limit foods that are high in fat and processed sugars, such as fried or sweet foods. Incision care  Follow instructions from your health care provider about how to take care of your incision or incisions. Make sure you: Wash your hands with soap and water for at least 20 seconds before and after you change your bandage (dressing). If soap and water are not available, use hand sanitizer. Change your dressing as told by your health care provider. Leave stitches (sutures), staples, skin glue, or adhesive strips in place. These skin  closures may need to stay in place for 2 weeks or longer. If adhesive strip edges start to loosen and curl up, you may trim the loose edges. Do not remove adhesive strips completely unless your health care provider tells you to do that. Keep your incision area and your dressing clean and dry. Check your incision area every day for signs of infection. Check for: Redness, swelling, or pain. Fluid or blood. Warmth. Pus or a bad smell. Activity Rest as told by your health care provider. Avoid sitting for a long time without moving. Get up to take short walks every 1-2 hours. This is important to improve blood flow and breathing. Ask for help if you feel weak or unsteady. Return to your normal activities as told by your health care provider. Ask your health care provider what activities are safe for you. Do not drive until your health care provider says that it is safe. Do not lift anything that is heavier than 10 lb (4.5 kg), or the limit that you are told, until your health care provider says that it is safe. Do not douche, use tampons, or have sex until your health care provider approves. General instructions Do not use any products that contain nicotine or tobacco. These products include cigarettes, chewing tobacco, and vaping devices, such as e-cigarettes. These can delay incision healing after surgery. If you need help quitting, ask your health care provider. Wear compression stockings as told by your health care provider. These stockings help to prevent blood clots and reduce swelling in your legs. Do not take baths, swim, or use a hot tub until your health care provider approves. Ask your health care provider if you may take showers. You may only be allowed to take sponge baths. Keep all follow-up visits. This is important. Contact a health care provider if: You have pain when you urinate. You have pus, a bad smell, or a bad-smelling discharge coming from your vagina or from an incision. You  have fluid or blood coming from an incision or an incision starts to open. You have redness, swelling, or pain around an incision or an incision feels warm to the touch. You have a fever or a rash. You have abdominal pain that gets worse or does not get better with medicine. You feel light-headed, have nausea and vomiting, or both. Get help right away if: You have pain in your chest or leg. You develop shortness of breath. You faint. You have increased or heavy vaginal bleeding, such as soaking a sanitary napkin in an hour. These symptoms may represent a serious problem that is an emergency. Do not wait to see if the symptoms will go away. Get medical help right away. Call your local emergency services (911 in the [place] Do not drive yourself to the hospital. Summary After the procedure, it is common to have pain, tiredness, and occasional bleeding from the vagina. Follow  instructions from your health care provider about how to take care of your incision or incisions. Check your incision area every day for signs of infection, and report any symptoms to your health care provider. Follow instructions from your health care provider about activities and restrictions. This information is not intended to replace advice given to you by your health care provider. Make sure you discuss any questions you have with your health care provider. Document Revised: 04/07/2020 Document Reviewed: 04/07/2020 Elsevier Patient Education  2023 [employer] General Anesthesia, Adult, Care After The following information offers guidance on how to care for yourself after your procedure. Your health care provider may also give you more specific instructions. If you have problems or questions, contact your health care provider. What can I expect after the procedure? After the procedure, it is common for people to: Have pain or discomfort at the IV site. Have nausea or vomiting. Have a sore throat or hoarseness. Have  trouble concentrating. Feel cold or chills. Feel weak, sleepy, or tired (fatigue). Have soreness and body aches. These can affect parts of the body that were not involved in surgery. Follow these instructions at home: For the time period you were told by your health care provider:  Rest. Do not participate in activities where you could fall or become injured. Do not drive or use machinery. Do not drink alcohol. Do not take sleeping pills or medicines that cause drowsiness. Do not make important decisions or sign legal documents. Do not take care of children on your own. General instructions Drink enough fluid to keep your urine pale yellow. If you have sleep apnea, surgery and certain medicines can increase your risk for breathing problems. Follow instructions from your health care provider about wearing your sleep device: Anytime you are sleeping, including during daytime naps. While taking prescription pain medicines, sleeping medicines, or medicines that make you drowsy. Return to your normal activities as told by your health care provider. Ask your health care provider what activities are safe for you. Take over-the-counter and prescription medicines only as told by your health care provider. Do not use any products that contain nicotine or tobacco. These products include cigarettes, chewing tobacco, and vaping devices, such as e-cigarettes. These can delay incision healing after surgery. If you need help quitting, ask your health care provider. Contact a health care provider if: You have nausea or vomiting that does not get better with medicine. You vomit every time you eat or drink. You have pain that does not get better with medicine. You cannot urinate or have bloody urine. You develop a skin rash. You have a fever. Get help right away if: You have trouble breathing. You have chest pain. You vomit blood. These symptoms may be an emergency. Get help right away. Call 911. Do  not wait to see if the symptoms will go away. Do not drive yourself to the hospital. Summary After the procedure, it is common to have a sore throat, hoarseness, nausea, vomiting, or to feel weak, sleepy, or fatigue. For the time period you were told by your health care provider, do not drive or use machinery. Get help right away if you have difficulty breathing, have chest pain, or vomit blood. These symptoms may be an emergency. This information is not intended to replace advice given to you by your health care provider. Make sure you discuss any questions you have with your health care provider. Document Revised: 08/13/2021 Document Reviewed: 08/13/2021 Elsevier Patient Education  2023 Elsevier  Inc. How to Use Chlorhexidine Before Surgery Chlorhexidine gluconate (CHG) is a germ-killing (antiseptic) solution that is used to clean the skin. It can get rid of the bacteria that normally live on the skin and can keep them away for about 24 hours. To clean your skin with CHG, you may be given: A CHG solution to use in the shower or as part of a sponge bath. A prepackaged cloth that contains CHG. Cleaning your skin with CHG may help lower the risk for infection: While you are staying in the intensive care unit of the hospital. If you have a vascular access, such as a central line, to provide short-term or long-term access to your veins. If you have a catheter to drain urine from your bladder. If you are on a ventilator. A ventilator is a machine that helps you breathe by moving air in and out of your lungs. After surgery. What are the risks? Risks of using CHG include: A skin reaction. Hearing loss, if CHG gets in your ears and you have a perforated eardrum. Eye injury, if CHG gets in your eyes and is not rinsed out. The CHG product catching fire. Make sure that you avoid smoking and flames after applying CHG to your skin. Do not use CHG: If you have a chlorhexidine allergy or have previously  reacted to chlorhexidine. On babies younger than 93 months of age. How to use CHG solution Use CHG only as told by your health care provider, and follow the instructions on the label. Use the full amount of CHG as directed. Usually, this is one bottle. During a shower Follow these steps when using CHG solution during a shower (unless your health care provider gives you different instructions): Start the shower. Use your normal soap and shampoo to wash your face and hair. Turn off the shower or move out of the shower stream. Pour the CHG onto a clean washcloth. Do not use any type of brush or rough-edged sponge. Starting at your neck, lather your body down to your toes. Make sure you follow these instructions: If you will be having surgery, pay special attention to the part of your body where you will be having surgery. Scrub this area for at least 1 minute. Do not use CHG on your head or face. If the solution gets into your ears or eyes, rinse them well with water. Avoid your genital area. Avoid any areas of skin that have broken skin, cuts, or scrapes. Scrub your back and under your arms. Make sure to wash skin folds. Let the lather sit on your skin for 1-2 minutes or as long as told by your health care provider. Thoroughly rinse your entire body in the shower. Make sure that all body creases and crevices are rinsed well. Dry off with a clean towel. Do not put any substances on your body afterward--such as powder, lotion, or perfume--unless you are told to do so by your health care provider. Only use lotions that are recommended by the manufacturer. Put on clean clothes or pajamas. If it is the night before your surgery, sleep in clean sheets.  During a sponge bath Follow these steps when using CHG solution during a sponge bath (unless your health care provider gives you different instructions): Use your normal soap and shampoo to wash your face and hair. Pour the CHG onto a clean  washcloth. Starting at your neck, lather your body down to your toes. Make sure you follow these instructions: If you will be  having surgery, pay special attention to the part of your body where you will be having surgery. Scrub this area for at least 1 minute. Do not use CHG on your head or face. If the solution gets into your ears or eyes, rinse them well with water. Avoid your genital area. Avoid any areas of skin that have broken skin, cuts, or scrapes. Scrub your back and under your arms. Make sure to wash skin folds. Let the lather sit on your skin for 1-2 minutes or as long as told by your health care provider. Using a different clean, wet washcloth, thoroughly rinse your entire body. Make sure that all body creases and crevices are rinsed well. Dry off with a clean towel. Do not put any substances on your body afterward--such as powder, lotion, or perfume--unless you are told to do so by your health care provider. Only use lotions that are recommended by the manufacturer. Put on clean clothes or pajamas. If it is the night before your surgery, sleep in clean sheets. How to use CHG prepackaged cloths Only use CHG cloths as told by your health care provider, and follow the instructions on the label. Use the CHG cloth on clean, dry skin. Do not use the CHG cloth on your head or face unless your health care provider tells you to. When washing with the CHG cloth: Avoid your genital area. Avoid any areas of skin that have broken skin, cuts, or scrapes. Before surgery Follow these steps when using a CHG cloth to clean before surgery (unless your health care provider gives you different instructions): Using the CHG cloth, vigorously scrub the part of your body where you will be having surgery. Scrub using a back-and-forth motion for 3 minutes. The area on your body should be completely wet with CHG when you are done scrubbing. Do not rinse. Discard the cloth and let the area air-dry. Do not put  any substances on the area afterward, such as powder, lotion, or perfume. Put on clean clothes or pajamas. If it is the night before your surgery, sleep in clean sheets.  For general bathing Follow these steps when using CHG cloths for general bathing (unless your health care provider gives you different instructions). Use a separate CHG cloth for each area of your body. Make sure you wash between any folds of skin and between your fingers and toes. Wash your body in the following order, switching to a new cloth after each step: The front of your neck, shoulders, and chest. Both of your arms, under your arms, and your hands. Your stomach and groin area, avoiding the genitals. Your right leg and foot. Your left leg and foot. The back of your neck, your back, and your buttocks. Do not rinse. Discard the cloth and let the area air-dry. Do not put any substances on your body afterward--such as powder, lotion, or perfume--unless you are told to do so by your health care provider. Only use lotions that are recommended by the manufacturer. Put on clean clothes or pajamas. Contact a health care provider if: Your skin gets irritated after scrubbing. You have questions about using your solution or cloth. You swallow any chlorhexidine. Call your local poison control center [phone] in the [place] Get help right away if: Your eyes itch badly, or they become very red or swollen. Your skin itches badly and is red or swollen. Your hearing changes. You have trouble seeing. You have swelling or tingling in your mouth or throat. You have trouble  breathing. These symptoms may represent a serious problem that is an emergency. Do not wait to see if the symptoms will go away. Get medical help right away. Call your local emergency services (911 in the [place] Do not drive yourself to the hospital. Summary Chlorhexidine gluconate (CHG) is a germ-killing (antiseptic) solution that is used to clean the skin.  Cleaning your skin with CHG may help to lower your risk for infection. You may be given CHG to use for bathing. It may be in a bottle or in a prepackaged cloth to use on your skin. Carefully follow your health care provider's instructions and the instructions on the product label. Do not use CHG if you have a chlorhexidine allergy. Contact your health care provider if your skin gets irritated after scrubbing. This information is not intended to replace advice given to you by your health care provider. Make sure you discuss any questions you have with your health care provider. Document Revised: 09/13/2021 Document Reviewed: 07/27/2020 Elsevier Patient Education  2023 [employer].

## 2022-05-06 NOTE — Progress Notes (Signed)
Follow up appointment for results: Pelvic sonogram  Chief Complaint  Patient presents with   discuss ultrasound results    Blood pressure 128/87, pulse 80, height 5\' 3"  (1.6 m), weight 177 lb (80.3 kg).  PELVIC COMPLETE WITH TRANSVAGINAL  Result Date: 05/06/2022  .14/8/2023an [facility] of Ultrasound [facility] Financial trader) accredited practice Center for [facility] [address] [place] Ordering Provider: [id number], MD                                                                                                      GYNECOLOGIC SONOGRAM Wendy Powell is a 41 y.o. No obstetric history on file. No LMP recorded. Patient has had an injection. She is here for a pelvic sonogram for left lower quadrant pain. Uterus                      5.5 x 3.4 x 4.4 cm, Total uterine volume 43 cc, homogeneous anteverted uterus,WNL Endometrium          7.7 mm, symmetrical, wnl Right ovary             3.8 x 1.9 x 2.5 cm, two simple right ovarian cysts (#1) 2.1 x 1.9 x 1.8 cm,(#2) 2 x 1.6 x 1.7 cm Left ovary                1.7 x 1.4 x 1.9 cm, normal,unable to slide left ovary No free fluid Technician Comments: PELVIC 46 TA/TV: homogeneous anteverted uterus,WNL,EEC 7.7 mm,normal left ovary,two simple right ovarian cysts (#1) 2.1 x 1.9 x 1.8 cm,(#2) 2 x 1.6 x 1.7 cm,unable to slide left ovary,positive sliding sign right ovary,bilateral prominent adnexal vessels,no free fluid,left adnexal pain during ultrasound Ch[facility]one [address] [place] 05/06/2022 1:05 PM  Clinical Impression and recommendations: I have reviewed the sonogram results above, combined with the patient's current clinical course, below are my impressions and any appropriate recommendations for management based on the sonographic findings. Uterus normal size shape and conotur 43 cc Endometrium thin on depo provera Ovaries: right ovary with 2 small cysts, functional, left ovary is normal but is adherent to the left pelvic  sidewall, does not slide 14/12/2021 05/06/2022 1:35 PM  MM 3D SCREEN BREAST BILATERAL  Result Date: 05/06/2022 CLINICAL DATA:  Screening. EXAM: DIGITAL SCREENING BILATERAL MAMMOGRAM WITH TOMOSYNTHESIS AND CAD TECHNIQUE: Bilateral screening digital craniocaudal and mediolateral oblique mammograms were obtained. Bilateral screening digital breast tomosynthesis was performed. The images were evaluated with computer-aided detection. COMPARISON:  Previous exam(s). ACR Breast Density Category a: The breast tissue is almost entirely fatty. FINDINGS: In the right breast, a possible mass warrants further evaluation. In the left breast, no findings suspicious for malignancy. IMPRESSION: Further evaluation is suggested for a possible mass in the right breast. RECOMMENDATION: Diagnostic mammogram and possibly ultrasound of the right breast. (Code:[id number]) The patient will be contacted regarding the findings, and additional imaging will be scheduled. BI-RADS CATEGORY  0: Incomplete. Need additional imaging evaluation and/or prior mammograms for comparison. Electronically Signed  By: Sande Brothers M.D.   On: 05/06/2022 08:39      MEDS ordered this encounter: Meds ordered this encounter  Medications   ketorolac (TORADOL) 10 MG tablet    Sig: Take 1 tablet (10 mg total) by mouth every 8 (eight) hours as needed.    Dispense:  15 tablet    Refill:  0   HYDROcodone-acetaminophen (NORCO/VICODIN) 5-325 MG tablet    Sig: Take 1 tablet by mouth every 6 (six) hours as needed.    Dispense:  15 tablet    Refill:  0    Orders for this encounter: No orders of the defined types were placed in this encounter.   Impression + Management Plan   ICD-10-CM   1. Acute on Chronic left lower quadrant pain  R10.32    G89.29    periodic but this is the worst ever, no torsion on sonogram but adherent with prominrent proximal vascualture which could be some venous compression     Proceed with laparoscopic Left salpingo  oophorectomy and right salpingectomy(for permanent sterilization) 12/13/123  Follow Up: Return in about 2 weeks (around 05/20/2022) for MyChart Connect visit, Post Op, with Dr Despina Hidden.     All questions were answered.  Past Medical History:  Diagnosis Date   Chronic kidney disease    kidney stone    Past Surgical History:  Procedure Laterality Date   CESAREAN SECTION      OB History   No obstetric history on file.     No Known Allergies  Social History   Socioeconomic History   Marital status: Single    Spouse name: Not on file   Number of children: Not on file   Years of education: Not on file   Highest education level: Not on file  Occupational History   Not on file  Tobacco Use   Smoking status: Every Day    Packs/day: 0.50    Years: 17.00    Total pack years: 8.50    Types: Cigarettes   Smokeless tobacco: Never  Substance and Sexual Activity   Alcohol use: Yes    Comment: occ   Drug use: No   Sexual activity: Yes    Birth control/protection: Injection  Other Topics Concern   Not on file  Social History Narrative   Not on file   Social Determinants of Health   Financial Resource Strain: Not on file  Food Insecurity: Not on file  Transportation Needs: Not on file  Physical Activity: Not on file  Stress: Not on file  Social Connections: Not on file    History reviewed. No pertinent family history.

## 2022-05-09 ENCOUNTER — Other Ambulatory Visit (HOSPITAL_COMMUNITY): Payer: Self-pay | Admitting: Obstetrics & Gynecology

## 2022-05-09 ENCOUNTER — Encounter (HOSPITAL_COMMUNITY)
Admission: RE | Admit: 2022-05-09 | Discharge: 2022-05-09 | Disposition: A | Discharge status: Home / Self Care | Payer: 59 | Source: Ambulatory Visit | Attending: Obstetrics & Gynecology | Admitting: Obstetrics & Gynecology

## 2022-05-09 ENCOUNTER — Encounter (HOSPITAL_COMMUNITY): Payer: Self-pay

## 2022-05-09 DIAGNOSIS — Z01818 Encounter for other preprocedural examination: Secondary | ICD-10-CM | POA: Insufficient documentation

## 2022-05-09 DIAGNOSIS — F17200 Nicotine dependence, unspecified, uncomplicated: Secondary | ICD-10-CM

## 2022-05-09 DIAGNOSIS — R928 Other abnormal and inconclusive findings on diagnostic imaging of breast: Secondary | ICD-10-CM

## 2022-05-09 DIAGNOSIS — Z0289 Encounter for other administrative examinations: Secondary | ICD-10-CM

## 2022-05-09 HISTORY — DX: Personal history of urinary calculi: Z87.442

## 2022-05-09 LAB — COMPREHENSIVE METABOLIC PANEL
ALT: 28 U/L (ref 0–44)
AST: 25 U/L (ref 15–41)
Albumin: 3.5 g/dL (ref 3.5–5.0)
Alkaline Phosphatase: 65 U/L (ref 38–126)
Anion gap: 5 (ref 5–15)
BUN: 20 mg/dL (ref 6–20)
CO2: 21 mmol/L — ABNORMAL LOW (ref 22–32)
Calcium: 8.3 mg/dL — ABNORMAL LOW (ref 8.9–10.3)
Chloride: 110 mmol/L (ref 98–111)
Creatinine, Ser: 0.79 mg/dL (ref 0.44–1.00)
GFR, Estimated: >60 mL/min (ref >60)
Glucose, Bld: 156 mg/dL — ABNORMAL HIGH (ref 70–99)
Potassium: 3.7 mmol/L (ref 3.5–5.1)
Sodium: 136 mmol/L (ref 135–145)
Total Bilirubin: 0.3 mg/dL (ref 0.3–1.2)
Total Protein: 6.3 g/dL — ABNORMAL LOW (ref 6.5–8.1)

## 2022-05-09 LAB — URINALYSIS, ROUTINE W REFLEX MICROSCOPIC
Bilirubin Urine: NEGATIVE
Glucose, UA: NEGATIVE mg/dL
Hgb urine dipstick: NEGATIVE
Ketones, ur: 5 mg/dL — AB
Leukocytes,Ua: NEGATIVE
Nitrite: NEGATIVE
Protein, ur: NEGATIVE mg/dL
Specific Gravity, Urine: 1.028 (ref 1.005–1.030)
pH: 5 (ref 5.0–8.0)

## 2022-05-09 LAB — CBC
HCT: 38.1 % (ref 36.0–46.0)
Hemoglobin: 12.5 g/dL (ref 12.0–15.0)
MCH: 30.2 pg (ref 26.0–34.0)
MCHC: 32.8 g/dL (ref 30.0–36.0)
MCV: 92 fL (ref 80.0–100.0)
Platelets: 273 10*3/uL (ref 150–400)
RBC: 4.14 MIL/uL (ref 3.87–5.11)
RDW: 13.1 % (ref 11.5–15.5)
WBC: 9.1 10*3/uL (ref 4.0–10.5)
nRBC: 0 % (ref 0.0–0.2)

## 2022-05-09 LAB — RAPID HIV SCREEN (HIV 1/2 AB+AG)
HIV 1/2 Antibodies: NONREACTIVE
HIV-1 P24 Antigen - HIV24: NONREACTIVE

## 2022-05-09 LAB — POCT PREGNANCY, URINE: Preg Test, Ur: NEGATIVE

## 2022-05-11 ENCOUNTER — Other Ambulatory Visit: Payer: Self-pay

## 2022-05-11 ENCOUNTER — Ambulatory Visit (HOSPITAL_COMMUNITY): Payer: 59 | Admitting: Certified Registered Nurse Anesthetist

## 2022-05-11 ENCOUNTER — Encounter (HOSPITAL_COMMUNITY)
Admission: RE | Disposition: A | Discharge status: Home / Self Care | Payer: Self-pay | Source: Ambulatory Visit | Attending: Obstetrics & Gynecology

## 2022-05-11 ENCOUNTER — Ambulatory Visit (HOSPITAL_COMMUNITY)
Admission: RE | Admit: 2022-05-11 | Discharge: 2022-05-11 | Disposition: A | Discharge status: Home / Self Care | Payer: 59 | Source: Ambulatory Visit | Attending: Obstetrics & Gynecology | Admitting: Obstetrics & Gynecology

## 2022-05-11 ENCOUNTER — Encounter (HOSPITAL_COMMUNITY): Payer: Self-pay | Admitting: Obstetrics & Gynecology

## 2022-05-11 ENCOUNTER — Ambulatory Visit (HOSPITAL_BASED_OUTPATIENT_CLINIC_OR_DEPARTMENT_OTHER): Payer: 59 | Admitting: Certified Registered Nurse Anesthetist

## 2022-05-11 DIAGNOSIS — Z302 Encounter for sterilization: Secondary | ICD-10-CM | POA: Insufficient documentation

## 2022-05-11 DIAGNOSIS — R1032 Left lower quadrant pain: Secondary | ICD-10-CM

## 2022-05-11 DIAGNOSIS — K660 Peritoneal adhesions (postprocedural) (postinfection): Secondary | ICD-10-CM

## 2022-05-11 DIAGNOSIS — G8929 Other chronic pain: Secondary | ICD-10-CM

## 2022-05-11 DIAGNOSIS — N83202 Unspecified ovarian cyst, left side: Secondary | ICD-10-CM | POA: Diagnosis not present

## 2022-05-11 DIAGNOSIS — F17210 Nicotine dependence, cigarettes, uncomplicated: Secondary | ICD-10-CM | POA: Insufficient documentation

## 2022-05-11 DIAGNOSIS — Z01818 Encounter for other preprocedural examination: Secondary | ICD-10-CM

## 2022-05-11 HISTORY — PX: LAPAROSCOPIC UNILATERAL SALPINGECTOMY: SHX5934

## 2022-05-11 HISTORY — PX: LAPAROSCOPIC UNILATERAL SALPINGO OOPHERECTOMY: SHX5935

## 2022-05-11 SURGERY — SALPINGECTOMY, UNILATERAL, LAPAROSCOPIC
Anesthesia: General | Site: Abdomen | Laterality: Right

## 2022-05-11 MED ORDER — SUGAMMADEX SODIUM 200 MG/2ML IV SOLN
INTRAVENOUS | Status: DC | PRN
  Administered 2022-05-11: 321.2 mg via INTRAVENOUS

## 2022-05-11 MED ORDER — LACTATED RINGERS IV SOLN
INTRAVENOUS | Status: DC
  Administered 2022-05-11: 500 mL via INTRAVENOUS

## 2022-05-11 MED ORDER — LIDOCAINE 2% (20 MG/ML) 5 ML SYRINGE
INTRAMUSCULAR | Status: DC | PRN
  Administered 2022-05-11: 80 mg via INTRAVENOUS

## 2022-05-11 MED ORDER — LIDOCAINE HCL (PF) 0.5 % IJ SOLN
INTRAMUSCULAR | Status: AC
  Filled 2022-05-11: qty 50

## 2022-05-11 MED ORDER — ROCURONIUM BROMIDE 10 MG/ML (PF) SYRINGE
PREFILLED_SYRINGE | INTRAVENOUS | Status: DC | PRN
  Administered 2022-05-11: 80 mg via INTRAVENOUS

## 2022-05-11 MED ORDER — ONDANSETRON HCL 4 MG/2ML IJ SOLN
INTRAMUSCULAR | Status: DC | PRN
  Administered 2022-05-11: 4 mg via INTRAVENOUS

## 2022-05-11 MED ORDER — PROPOFOL 500 MG/50ML IV EMUL
INTRAVENOUS | Status: AC
  Filled 2022-05-11: qty 50

## 2022-05-11 MED ORDER — BUPIVACAINE LIPOSOME 1.3 % IJ SUSP
20.0000 mL | Freq: Once | INTRAMUSCULAR | Status: DC
  Filled 2022-05-11: qty 20

## 2022-05-11 MED ORDER — 0.9 % SODIUM CHLORIDE (POUR BTL) OPTIME
TOPICAL | Status: DC | PRN
  Administered 2022-05-11: 1000 mL

## 2022-05-11 MED ORDER — MIDAZOLAM HCL 2 MG/2ML IJ SOLN: 0.5000 mg | Freq: Once | INTRAMUSCULAR | Status: DC | PRN

## 2022-05-11 MED ORDER — ROCURONIUM BROMIDE 10 MG/ML (PF) SYRINGE
PREFILLED_SYRINGE | INTRAVENOUS | Status: AC
  Filled 2022-05-11: qty 10

## 2022-05-11 MED ORDER — MIDAZOLAM HCL 2 MG/2ML IJ SOLN
INTRAMUSCULAR | Status: AC
  Filled 2022-05-11: qty 2

## 2022-05-11 MED ORDER — KETOROLAC TROMETHAMINE 10 MG PO TABS: 10.0000 mg | ORAL_TABLET | Freq: Three times a day (TID) | ORAL | 0 refills | Status: DC | PRN

## 2022-05-11 MED ORDER — PROPOFOL 10 MG/ML IV BOLUS
INTRAVENOUS | Status: DC | PRN
  Administered 2022-05-11: 150 mg via INTRAVENOUS

## 2022-05-11 MED ORDER — ACETAMINOPHEN 325 MG PO TABS
ORAL_TABLET | ORAL | Status: DC | PRN
  Administered 2022-05-11: 1000 mg via ORAL

## 2022-05-11 MED ORDER — MIDAZOLAM HCL 5 MG/5ML IJ SOLN
INTRAMUSCULAR | Status: DC | PRN
  Administered 2022-05-11 (×2): 1 mg via INTRAVENOUS

## 2022-05-11 MED ORDER — CHLORHEXIDINE GLUCONATE 0.12 % MT SOLN
15.0000 mL | Freq: Once | OROMUCOSAL | Status: AC
  Administered 2022-05-11: 15 mL via OROMUCOSAL

## 2022-05-11 MED ORDER — HYDROMORPHONE HCL 1 MG/ML IJ SOLN
0.2500 mg | INTRAMUSCULAR | Status: DC | PRN
  Administered 2022-05-11: 0.5 mg via INTRAVENOUS
  Filled 2022-05-11: qty 0.5

## 2022-05-11 MED ORDER — OXYCODONE-ACETAMINOPHEN 5-325 MG PO TABS: 1.0000 | ORAL_TABLET | Freq: Four times a day (QID) | ORAL | 0 refills | Status: DC | PRN

## 2022-05-11 MED ORDER — CEFAZOLIN SODIUM-DEXTROSE 2-4 GM/100ML-% IV SOLN
INTRAVENOUS | Status: AC
  Filled 2022-05-11: qty 100

## 2022-05-11 MED ORDER — PROMETHAZINE HCL 25 MG/ML IJ SOLN
INTRAMUSCULAR | Status: AC
  Filled 2022-05-11: qty 1

## 2022-05-11 MED ORDER — BUPIVACAINE LIPOSOME 1.3 % IJ SUSP
INTRAMUSCULAR | Status: DC | PRN
  Administered 2022-05-11: 20 mL

## 2022-05-11 MED ORDER — KETOROLAC TROMETHAMINE 30 MG/ML IJ SOLN
30.0000 mg | Freq: Once | INTRAMUSCULAR | Status: AC
  Administered 2022-05-11: 30 mg via INTRAVENOUS

## 2022-05-11 MED ORDER — FENTANYL CITRATE (PF) 250 MCG/5ML IJ SOLN
INTRAMUSCULAR | Status: DC | PRN
  Administered 2022-05-11 (×3): 50 ug via INTRAVENOUS
  Administered 2022-05-11 (×2): 25 ug via INTRAVENOUS
  Administered 2022-05-11: 50 ug via INTRAVENOUS

## 2022-05-11 MED ORDER — CHLORHEXIDINE GLUCONATE 0.12 % MT SOLN
OROMUCOSAL | Status: AC
  Filled 2022-05-11: qty 15

## 2022-05-11 MED ORDER — PROPOFOL 10 MG/ML IV BOLUS
INTRAVENOUS | Status: AC
  Filled 2022-05-11: qty 20

## 2022-05-11 MED ORDER — BUPIVACAINE LIPOSOME 1.3 % IJ SUSP
INTRAMUSCULAR | Status: AC
  Filled 2022-05-11: qty 20

## 2022-05-11 MED ORDER — CEFAZOLIN SODIUM-DEXTROSE 2-4 GM/100ML-% IV SOLN
2.0000 g | INTRAVENOUS | Status: AC
  Administered 2022-05-11: 2 g via INTRAVENOUS

## 2022-05-11 MED ORDER — HYDROCODONE-ACETAMINOPHEN 7.5-325 MG PO TABS
1.0000 | ORAL_TABLET | Freq: Once | ORAL | Status: AC | PRN
  Administered 2022-05-11: 1 via ORAL
  Filled 2022-05-11: qty 1

## 2022-05-11 MED ORDER — SCOPOLAMINE 1 MG/3DAYS TD PT72
1.0000 | MEDICATED_PATCH | TRANSDERMAL | Status: DC
  Administered 2022-05-11: 1.5 mg via TRANSDERMAL
  Filled 2022-05-11: qty 1

## 2022-05-11 MED ORDER — FENTANYL CITRATE (PF) 250 MCG/5ML IJ SOLN
INTRAMUSCULAR | Status: AC
  Filled 2022-05-11: qty 5

## 2022-05-11 MED ORDER — ONDANSETRON 8 MG PO TBDP: 8.0000 mg | ORAL_TABLET | Freq: Three times a day (TID) | ORAL | 0 refills | Status: DC | PRN

## 2022-05-11 MED ORDER — ACETAMINOPHEN 500 MG PO TABS
ORAL_TABLET | ORAL | Status: AC
  Filled 2022-05-11: qty 2

## 2022-05-11 MED ORDER — PROMETHAZINE HCL 25 MG/ML IJ SOLN
INTRAMUSCULAR | Status: DC | PRN
  Administered 2022-05-11: 12.5 mg via INTRAVENOUS

## 2022-05-11 MED ORDER — POVIDONE-IODINE 10 % EX SWAB: 2.0000 | Freq: Once | CUTANEOUS | Status: DC

## 2022-05-11 MED ORDER — KETAMINE HCL 10 MG/ML IJ SOLN
INTRAMUSCULAR | Status: DC | PRN
  Administered 2022-05-11: 25 mg via INTRAVENOUS

## 2022-05-11 MED ORDER — ORAL CARE MOUTH RINSE: 15.0000 mL | Freq: Once | OROMUCOSAL | Status: AC

## 2022-05-11 MED ORDER — KETOROLAC TROMETHAMINE 30 MG/ML IJ SOLN
INTRAMUSCULAR | Status: AC
  Filled 2022-05-11: qty 1

## 2022-05-11 MED ORDER — KETAMINE HCL 50 MG/5ML IJ SOSY
PREFILLED_SYRINGE | INTRAMUSCULAR | Status: AC
  Filled 2022-05-11: qty 5

## 2022-05-11 MED ORDER — PROPOFOL 500 MG/50ML IV EMUL
INTRAVENOUS | Status: DC | PRN
  Administered 2022-05-11: 25 ug/kg/min via INTRAVENOUS

## 2022-05-11 MED ORDER — DEXAMETHASONE SODIUM PHOSPHATE 10 MG/ML IJ SOLN
INTRAMUSCULAR | Status: DC | PRN
  Administered 2022-05-11: 10 mg via INTRAVENOUS

## 2022-05-11 MED ORDER — ONDANSETRON HCL 4 MG/2ML IJ SOLN: 4.0000 mg | Freq: Once | INTRAMUSCULAR | Status: DC | PRN

## 2022-05-11 SURGICAL SUPPLY — 46 items
ADH SKN CLS APL DERMABOND .7 (GAUZE/BANDAGES/DRESSINGS) ×2
BAG HAMPER (MISCELLANEOUS) ×2 IMPLANT
BLADE SURG SZ11 CARB STEEL (BLADE) ×2 IMPLANT
CLOTH BEACON ORANGE TIMEOUT ST (SAFETY) ×2 IMPLANT
COVER LIGHT HANDLE STERIS (MISCELLANEOUS) ×4 IMPLANT
DERMABOND ADVANCED .7 DNX12 (GAUZE/BANDAGES/DRESSINGS) ×2 IMPLANT
DRAPE HALF SHEET 40X57 (DRAPES) ×2 IMPLANT
ELECT REM PT RETURN 9FT ADLT (ELECTROSURGICAL) ×2
ELECTRODE REM PT RTRN 9FT ADLT (ELECTROSURGICAL) ×2 IMPLANT
FILTER SMOKE EVAC LAPAROSHD (FILTER) ×2 IMPLANT
GAUZE 4X4 16PLY ~~LOC~~+RFID DBL (SPONGE) ×2 IMPLANT
GLOVE BIOGEL PI IND STRL 7.0 (GLOVE) ×4 IMPLANT
GLOVE BIOGEL PI IND STRL 8 (GLOVE) ×2 IMPLANT
GLOVE ECLIPSE 8.0 STRL XLNG CF (GLOVE) ×4 IMPLANT
GOWN STRL REUS W/TWL LRG LVL3 (GOWN DISPOSABLE) ×2 IMPLANT
GOWN STRL REUS W/TWL XL LVL3 (GOWN DISPOSABLE) ×2 IMPLANT
INST SET LAPROSCOPIC GYN AP (KITS) ×2 IMPLANT
KIT TURNOVER KIT A (KITS) ×2 IMPLANT
MANIFOLD NEPTUNE II (INSTRUMENTS) ×2 IMPLANT
NDL HYPO 18GX1.5 BLUNT FILL (NEEDLE) ×2 IMPLANT
NDL HYPO 21X1.5 SAFETY (NEEDLE) ×2 IMPLANT
NEEDLE 22X1 1/2 (OR ONLY) (NEEDLE) ×2 IMPLANT
NEEDLE HYPO 18GX1.5 BLUNT FILL (NEEDLE) ×2 IMPLANT
NEEDLE HYPO 21X1.5 SAFETY (NEEDLE) ×2 IMPLANT
NEEDLE INSUFFLATION 120MM (ENDOMECHANICALS) ×2 IMPLANT
PACK PERI GYN (CUSTOM PROCEDURE TRAY) ×2 IMPLANT
PAD ARMBOARD 7.5X6 YLW CONV (MISCELLANEOUS) ×2 IMPLANT
SET BASIN LINEN APH (SET/KITS/TRAYS/PACK) ×2 IMPLANT
SET TUBE IRRIG SUCTION NO TIP (IRRIGATION / IRRIGATOR) IMPLANT
SET TUBE SMOKE EVAC HIGH FLOW (TUBING) IMPLANT
SHEARS HARMONIC ACE PLUS 36CM (ENDOMECHANICALS) ×2 IMPLANT
SLEEVE XCEL OPT CAN 5 100 (ENDOMECHANICALS) ×2 IMPLANT
SLEEVE Z-THREAD 5X100MM (TROCAR) ×2 IMPLANT
SOL ANTI FOG 6CC (MISCELLANEOUS) ×2 IMPLANT
STAPLER VISISTAT 35W (STAPLE) ×2 IMPLANT
SUT VICRYL 0 UR6 27IN ABS (SUTURE) ×2 IMPLANT
SUT VICRYL AB 3-0 FS1 BRD 27IN (SUTURE) ×2 IMPLANT
SYR 10ML LL (SYRINGE) ×2 IMPLANT
SYR 20ML LL LF (SYRINGE) ×4 IMPLANT
SYS BAG RETRIEVAL 10MM (BASKET) ×2
SYSTEM BAG RETRIEVAL 10MM (BASKET) ×2 IMPLANT
TRAY FOLEY W/BAG SLVR 16FR (SET/KITS/TRAYS/PACK) ×2
TRAY FOLEY W/BAG SLVR 16FR ST (SET/KITS/TRAYS/PACK) ×2 IMPLANT
TROCAR XCEL NON-BLD 11X100MML (ENDOMECHANICALS) ×2 IMPLANT
TROCAR Z-THREAD FIOS 5X100MM (TROCAR) ×2 IMPLANT
WARMER LAPAROSCOPE (MISCELLANEOUS) ×2 IMPLANT

## 2022-05-11 NOTE — Anesthesia Procedure Notes (Signed)
Procedure Name: Intubation Date/Time: 05/11/2022 1:48 PM  Performed by: Cy Blamer, CRNAPre-anesthesia Checklist: Patient identified, Emergency Drugs available, Suction available and Patient being monitored Patient Re-evaluated:Patient Re-evaluated prior to induction Oxygen Delivery Method: Circle system utilized Preoxygenation: Pre-oxygenation with 100% oxygen Induction Type: IV induction Ventilation: Mask ventilation without difficulty Laryngoscope Size: Miller and 2 Grade View: Grade I Tube type: Oral Tube size: 6.5 mm Number of attempts: 1 Airway Equipment and Method: Bite block Placement Confirmation: ETT inserted through vocal cords under direct vision, positive ETCO2 and breath sounds checked- equal and bilateral Secured at: 21 cm Tube secured with: Tape Dental Injury: Teeth and Oropharynx as per pre-operative assessment

## 2022-05-11 NOTE — H&P (Signed)
Preoperative History and Physical  Wendy Powell is a 41 y.o. No obstetric history on file. with No LMP recorded (lmp unknown). Patient has had an injection. admitted for a laparoscopic removal of left tube and ovary plus removal of right Fallopian tube for sterilization and ovarian cancer prophylaxis.  Pt has relatively long standing episodic LLQ pain Not GI or GU associated symptoms Generally happens as her Depo runs out But this time it has persisted Sonogram reveals adherent left adnexa with tenderness during the exam Desires permanent sterilization and ovarian cancer prophylaxis  PMH:    Past Medical History:  Diagnosis Date   History of kidney stones     PSH:     Past Surgical History:  Procedure Laterality Date   CESAREAN SECTION      POb/GynH:      OB History   No obstetric history on file.     SH:   Social History   Tobacco Use   Smoking status: Every Day    Packs/day: 0.50    Years: 17.00    Total pack years: 8.50    Types: Cigarettes   Smokeless tobacco: Never  Substance Use Topics   Alcohol use: Yes    Comment: occ   Drug use: No    FH:   History reviewed. No pertinent family history.   Allergies: No Known Allergies  Medications:       Current Facility-Administered Medications:    bupivacaine liposome (EXPAREL) 1.3 % injection 266 mg, 20 mL, Infiltration, Once, Kharter Brew, Mertie Clause, MD   ceFAZolin (ANCEF) IVPB 2g/100 mL premix, 2 g, Intravenous, On Call to OR, Elonda Husky, Mertie Clause, MD   lactated ringers infusion, , Intravenous, Continuous, Pugh, Ellyn Hack, MD, Last Rate: 10 mL/hr at 05/11/22 1244, New Bag at 05/11/22 1244   povidone-iodine 10 % swab 2 Application, 2 Application, Topical, Once, Florian Buff, MD   scopolamine (TRANSDERM-SCOP) 1 MG/3DAYS 1.5 mg, 1 patch, Transdermal, Q72H, Pugh, Ellyn Hack, MD, 1.5 mg at 05/11/22 1243  Facility-Administered Medications Ordered in Other Encounters:    fentaNYL citrate (PF) (SUBLIMAZE) injection, , Intravenous,  Anesthesia Intra-op, Trixie Rude, MD, 50 mcg at 05/11/22 1236   midazolam (VERSED) 5 MG/5ML injection, , Intravenous, Anesthesia Intra-op, Trixie Rude, MD, 1 mg at 05/11/22 1236  Review of Systems:   Review of Systems  Constitutional: Negative for fever, chills, weight loss, malaise/fatigue and diaphoresis.  HENT: Negative for hearing loss, ear pain, nosebleeds, congestion, sore throat, neck pain, tinnitus and ear discharge.   Eyes: Negative for blurred vision, double vision, photophobia, pain, discharge and redness.  Respiratory: Negative for cough, hemoptysis, sputum production, shortness of breath, wheezing and stridor.   Cardiovascular: Negative for chest pain, palpitations, orthopnea, claudication, leg swelling and PND.  Gastrointestinal: Positive for abdominal pain. Negative for heartburn, nausea, vomiting, diarrhea, constipation, blood in stool and melena.  Genitourinary: Negative for dysuria, urgency, frequency, hematuria and flank pain.  Musculoskeletal: Negative for myalgias, back pain, joint pain and falls.  Skin: Negative for itching and rash.  Neurological: Negative for dizziness, tingling, tremors, sensory change, speech change, focal weakness, seizures, loss of consciousness, weakness and headaches.  Endo/Heme/Allergies: Negative for environmental allergies and polydipsia. Does not bruise/bleed easily.  Psychiatric/Behavioral: Negative for depression, suicidal ideas, hallucinations, memory loss and substance abuse. The patient is not nervous/anxious and does not have insomnia.      PHYSICAL EXAM:  Blood pressure 136/85, pulse 85, temperature 98 F (36.7 C), temperature source Oral, resp. rate 15,  height 5\' 3"  (1.6 m), weight 80.3 kg, SpO2 98 %.    Vitals reviewed. Constitutional: She is oriented to person, place, and time. She appears well-developed and well-nourished.  HENT:  Head: Normocephalic and atraumatic.  Right Ear: External ear normal.  Left Ear: External  ear normal.  Nose: Nose normal.  Mouth/Throat: Oropharynx is clear and moist.  Eyes: Conjunctivae and EOM are normal. Pupils are equal, round, and reactive to light. Right eye exhibits no discharge. Left eye exhibits no discharge. No scleral icterus.  Neck: Normal range of motion. Neck supple. No tracheal deviation present. No thyromegaly present.  Cardiovascular: Normal rate, regular rhythm, normal heart sounds and intact distal pulses.  Exam reveals no gallop and no friction rub.   No murmur heard. Respiratory: Effort normal and breath sounds normal. No respiratory distress. She has no wheezes. She has no rales. She exhibits no tenderness.  GI: Soft. Bowel sounds are normal. She exhibits no distension and no mass. There is tenderness. There is no rebound and no guarding.  Genitourinary:       Vulva is normal without lesions Vagina is pink moist without discharge Cervix normal in appearance and pap is normal Uterus is normal size, contour, position, consistency, mobility, non-tender Adnexa is by sonogram  Musculoskeletal: Normal range of motion. She exhibits no edema and no tenderness.  Neurological: She is alert and oriented to person, place, and time. She has normal reflexes. She displays normal reflexes. No cranial nerve deficit. She exhibits normal muscle tone. Coordination normal.  Skin: Skin is warm and dry. No rash noted. No erythema. No pallor.  Psychiatric: She has a normal mood and affect. Her behavior is normal. Judgment and thought content normal.    Labs: Results for orders placed or performed during the hospital encounter of 05/09/22 (from the past 336 hour(s))  CBC   Collection Time: 05/09/22  9:01 AM  Result Value Ref Range   WBC 9.1 4.0 - 10.5 K/uL   RBC 4.14 3.87 - 5.11 MIL/uL   Hemoglobin 12.5 12.0 - 15.0 g/dL   HCT 38.1 36.0 - 46.0 %   MCV 92.0 80.0 - 100.0 fL   MCH 30.2 26.0 - 34.0 pg   MCHC 32.8 30.0 - 36.0 g/dL   RDW 13.1 11.5 - 15.5 %   Platelets 273 150 -  400 K/uL   nRBC 0.0 0.0 - 0.2 %  Comprehensive metabolic panel   Collection Time: 05/09/22  9:01 AM  Result Value Ref Range   Sodium 136 135 - 145 mmol/L   Potassium 3.7 3.5 - 5.1 mmol/L   Chloride 110 98 - 111 mmol/L   CO2 21 (L) 22 - 32 mmol/L   Glucose, Bld 156 (H) 70 - 99 mg/dL   BUN 20 6 - 20 mg/dL   Creatinine, Ser 0.79 0.44 - 1.00 mg/dL   Calcium 8.3 (L) 8.9 - 10.3 mg/dL   Total Protein 6.3 (L) 6.5 - 8.1 g/dL   Albumin 3.5 3.5 - 5.0 g/dL   AST 25 15 - 41 U/L   ALT 28 0 - 44 U/L   Alkaline Phosphatase 65 38 - 126 U/L   Total Bilirubin 0.3 0.3 - 1.2 mg/dL   GFR, Estimated >60 >60 mL/min   Anion gap 5 5 - 15  Urinalysis, Routine w reflex microscopic Urine, Clean Catch   Collection Time: 05/09/22  9:01 AM  Result Value Ref Range   Color, Urine YELLOW YELLOW   APPearance HAZY (A) CLEAR   Specific  Gravity, Urine 1.028 1.005 - 1.030   pH 5.0 5.0 - 8.0   Glucose, UA NEGATIVE NEGATIVE mg/dL   Hgb urine dipstick NEGATIVE NEGATIVE   Bilirubin Urine NEGATIVE NEGATIVE   Ketones, ur 5 (A) NEGATIVE mg/dL   Protein, ur NEGATIVE NEGATIVE mg/dL   Nitrite NEGATIVE NEGATIVE   Leukocytes,Ua NEGATIVE NEGATIVE  Rapid HIV screen (HIV 1/2 Ab+Ag)   Collection Time: 05/09/22  9:01 AM  Result Value Ref Range   HIV-1 P24 Antigen - HIV24 NON REACTIVE NON REACTIVE   HIV 1/2 Antibodies NON REACTIVE NON REACTIVE   Interpretation (HIV Ag Ab)      A non reactive test result means that HIV 1 or HIV 2 antibodies and HIV 1 p24 antigen were not detected in the specimen.  Pregnancy, urine POC   Collection Time: 05/09/22  9:15 AM  Result Value Ref Range   Preg Test, Ur NEGATIVE NEGATIVE    EKG: Orders placed or performed during the hospital encounter of 05/09/22   EKG 12-LEAD   EKG 12-LEAD    Imaging Studies: US PELVIC COMPLETE WITH TRANSVAGINAL  Result Date: 05/06/2022  .Marland Kitchenan Financial trader of Ultrasound Medicine Technical sales engineer) accredited practice Center for [facility] [address] [place] [address] Ordering Provider: Lazaro Arms, MD                                                                                                      GYNECOLOGIC SONOGRAM Wendy Powell is a 41 y.o. No obstetric history on file. No LMP recorded. Patient has had an injection. She is here for a pelvic sonogram for left lower quadrant pain. Uterus                      5.5 x 3.4 x 4.4 cm, Total uterine volume 43 cc, homogeneous anteverted uterus,WNL Endometrium          7.7 mm, symmetrical, wnl Right ovary             3.8 x 1.9 x 2.5 cm, two simple right ovarian cysts (#1) 2.1 x 1.9 x 1.8 cm,(#2) 2 x 1.6 x 1.7 cm Left ovary                1.7 x 1.4 x 1.9 cm, normal,unable to slide left ovary No free fluid Technician Comments: PELVIC US TA/TV: homogeneous anteverted uterus,WNL,EEC 7.7 mm,normal left ovary,two simple right ovarian cysts (#1) 2.1 x 1.9 x 1.8 cm,(#2) 2 x 1.6 x 1.7 cm,unable to slide left ovary,positive sliding sign right ovary,bilateral prominent adnexal vessels,no free fluid,left adnexal pain during ultrasound Chaperone [address] Flora Lipps 05/06/2022 1:05 PM  Clinical Impression and recommendations: I have reviewed the sonogram results above, combined with the patient's current clinical course, below are my impressions and any appropriate recommendations for management based on the sonographic findings. Uterus normal size shape and conotur 43 cc Endometrium thin on depo provera Ovaries: right ovary with 2 small cysts, functional, left ovary is normal but is adherent to the left pelvic sidewall, does  not slide Florian Buff 05/06/2022 1:35 PM  MM 3D SCREEN BREAST BILATERAL  Result Date: 05/06/2022 CLINICAL DATA:  Screening. EXAM: DIGITAL SCREENING BILATERAL MAMMOGRAM WITH TOMOSYNTHESIS AND CAD TECHNIQUE: Bilateral screening digital craniocaudal and mediolateral oblique mammograms were obtained. Bilateral screening digital breast tomosynthesis was performed. The images were  evaluated with computer-aided detection. COMPARISON:  Previous exam(s). ACR Breast Density Category a: The breast tissue is almost entirely fatty. FINDINGS: In the right breast, a possible mass warrants further evaluation. In the left breast, no findings suspicious for malignancy. IMPRESSION: Further evaluation is suggested for a possible mass in the right breast. RECOMMENDATION: Diagnostic mammogram and possibly ultrasound of the right breast. (Code:[id number]) The patient will be contacted regarding the findings, and additional imaging will be scheduled. BI-RADS CATEGORY  0: Incomplete. Need additional imaging evaluation and/or prior mammograms for comparison. Electronically Signed   By: Kristopher Oppenheim M.D.   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

## 2022-05-11 NOTE — Transfer of Care (Signed)
Immediate Anesthesia Transfer of Care Note  Patient: Wendy Powell  Procedure(s) Performed: LAPAROSCOPIC UNILATERAL SALPINGECTOMY (Right: Abdomen) LAPAROSCOPIC UNILATERAL SALPINGO OOPHORECTOMY (Left: Abdomen)  Patient Location: PACU  Anesthesia Type:General  Level of Consciousness: awake, alert , and oriented  Airway & Oxygen Therapy: Patient Spontanous Breathing and Patient connected to nasal cannula oxygen  Post-op Assessment: Report given to RN, Post -op Vital signs reviewed and stable, Patient moving all extremities X 4, and Patient able to stick tongue midline  Post vital signs: Reviewed  Last Vitals:  Vitals Value Taken Time  BP 117/80 05/11/22 1448  Temp 97.4   Pulse 83 05/11/22 1452  Resp 11 05/11/22 1452  SpO2 100 % 05/11/22 1452  Vitals shown include unvalidated device data.  Last Pain:  Vitals:   05/11/22 1206  TempSrc: Oral  PainSc: 3       Patients Stated Pain Goal: 2 (05/11/22 1206)  Complications: No notable events documented.

## 2022-05-11 NOTE — Anesthesia Preprocedure Evaluation (Signed)
Anesthesia Evaluation  Patient identified by MRN, date of birth, ID band Patient awake    Reviewed: Allergy & Precautions, NPO status , Patient's Chart, lab work & pertinent test results  Airway Mallampati: I       Dental no notable dental hx.    Pulmonary neg pulmonary ROS, Current Smoker and Patient abstained from smoking.   Pulmonary exam normal        Cardiovascular Exercise Tolerance: Good negative cardio ROS Normal cardiovascular exam     Neuro/Psych negative neurological ROS  negative psych ROS   GI/Hepatic negative GI ROS, Neg liver ROS,,,  Endo/Other  negative endocrine ROS    Renal/GU negative Renal ROS     Musculoskeletal negative musculoskeletal ROS (+)    Abdominal Normal abdominal exam  (+)   Peds  Hematology negative hematology ROS (+)   Anesthesia Other Findings LLQ pain  Reproductive/Obstetrics                             Anesthesia Physical Anesthesia Plan  ASA: 2  Anesthesia Plan: General   Post-op Pain Management:    Induction:   PONV Risk Score and Plan: 3 and Propofol infusion, Dexamethasone and Ondansetron  Airway Management Planned: Oral ETT  Additional Equipment:   Intra-op Plan:   Post-operative Plan:   Informed Consent: I have reviewed the patients History and Physical, chart, labs and discussed the procedure including the risks, benefits and alternatives for the proposed anesthesia with the patient or authorized representative who has indicated his/her understanding and acceptance.       Plan Discussed with: CRNA  Anesthesia Plan Comments:        Anesthesia Quick Evaluation

## 2022-05-11 NOTE — Op Note (Addendum)
   Preoperative diagnosis:  1.  Acute on chronic LLQ pain                                         2.  Adherence of left adnexa to the pelvic sidewall                                         3.  Desires sterilization and ovarian cancer prophylaxis  Postoperative diagnosis:  Same as above  Procedure:  Laparoscopic left salpingo oophorectomy and right salpingectomy  Surgeon:  Rockne Coons MD  Anesthesia:  Gen. Endotracheal  Findings:  .    Prominent left infundibulo pelvic ligament venous congestion, adherent left ovary and tube to the uterine fundus, creating left adnexal stretch  Description of operation:   The patient was taken to the operating room and placed in the supine position where she underwent general endotracheal anesthesia.   She was placed in the low lithotomy position.   She was prepped and draped in the usual sterile fashion and a Foley catheter was placed.   An incision was made superior to the umbilicus and a Veres needle was placed into the peritoneal cavity with one pass without difficulty.   The peritoneal cavity was then insufflated.   An 11 mm non-bladed direct visualization trocar was then used and placed into the peritoneal cavity with direct laparoscopic visualization again without difficulty.   Incisions were then made in the left lower quadrant and right lower quadrant.   A 5 mm trocar was placed in the left lower quadrant and a 5 mm trocar was placed in the right lower quadrant.   Both were done under direct visualization without difficulty.    The right Fallopian tube and ovary were normal Complete salpingectomy was performed on the right using harmonic scalpel  The left ovary was adhernet to the uterus medially and created pedicle stretch.   The venous drainage was prominent  The left  ovary and tube  was grasped.   The harmonic scalpel was used and the infundibulopelvic ligament on the left was coagulated and then transected.   THe remaining  peritoneal attachments of the left ovary were also taken down hemostatically.   The right ovary was identified and was normal.   An Endo Catch was placed and the left ovary was put in the bag and removed through the umbilical incision without difficulty.   The umbilical fascia was closed with 0 vicryl running.  The subcutaneous tissue was then reapproximated with 0 Vicryl.  The skin incisions were closed using 3-0 vicryl in a subcuticular fashion.  Dermabond was placed.   Exparel was injected in all 3 incisions.  The patient was awakened from anesthesia and taken to the recovery room in good stable condition with all counts being correct x3.    The estimated blood loss for the procedure was minimal.  She received ancef 2 grams and toradol pre operatively  Lazaro Arms, MD 05/11/2022 2:37 PM

## 2022-05-12 NOTE — Anesthesia Postprocedure Evaluation (Signed)
Anesthesia Post Note  Patient: RYLLIE NIELAND  Procedure(s) Performed: LAPAROSCOPIC UNILATERAL SALPINGECTOMY (Right: Abdomen) LAPAROSCOPIC UNILATERAL SALPINGO OOPHORECTOMY (Left: Abdomen)  Patient location during evaluation: PACU Anesthesia Type: General Level of consciousness: awake and alert Pain management: pain level controlled Vital Signs Assessment: post-procedure vital signs reviewed and stable Respiratory status: spontaneous breathing, nonlabored ventilation, respiratory function stable and patient connected to nasal cannula oxygen Cardiovascular status: blood pressure returned to baseline and stable Postop Assessment: no apparent nausea or vomiting Anesthetic complications: no   There were no known notable events for this encounter.   Last Vitals:  Vitals:   05/11/22 1545 05/11/22 1554  BP:  132/87  Pulse: 71 75  Resp: 19 17  Temp:    SpO2: 100% 99%    Last Pain:  Vitals:   05/11/22 1554  TempSrc:   PainSc: 3                  Glynis Smiles

## 2022-05-13 LAB — SURGICAL PATHOLOGY

## 2022-05-16 ENCOUNTER — Encounter (HOSPITAL_COMMUNITY): Payer: Self-pay | Admitting: Obstetrics & Gynecology

## 2022-05-18 ENCOUNTER — Ambulatory Visit (HOSPITAL_COMMUNITY)
Admission: RE | Admit: 2022-05-18 | Discharge: 2022-05-18 | Disposition: A | Discharge status: Home / Self Care | Payer: 59 | Source: Ambulatory Visit | Attending: Obstetrics & Gynecology | Admitting: Obstetrics & Gynecology

## 2022-05-18 ENCOUNTER — Encounter: Payer: Self-pay | Admitting: Obstetrics & Gynecology

## 2022-05-18 DIAGNOSIS — N6489 Other specified disorders of breast: Secondary | ICD-10-CM | POA: Diagnosis not present

## 2022-05-18 DIAGNOSIS — R928 Other abnormal and inconclusive findings on diagnostic imaging of breast: Secondary | ICD-10-CM

## 2022-05-20 ENCOUNTER — Telehealth: Payer: 59 | Admitting: Obstetrics & Gynecology

## 2022-05-20 ENCOUNTER — Encounter: Payer: Self-pay | Admitting: Obstetrics & Gynecology

## 2022-05-20 DIAGNOSIS — Z9889 Other specified postprocedural states: Secondary | ICD-10-CM

## 2022-05-20 NOTE — Progress Notes (Signed)
MyChart video visit Pt is at home  I am in my office 10 minutes total time  HPI: Patient returns for routine postoperative follow-up having undergone laparoscopic LSO + right salpingectomy for LLQ pain + desires sterilization on 05/11/22.  The patient's immediate postoperative recovery has been unremarkable. Since hospital discharge the patient reports no problems.   Current Outpatient Medications: aspirin-acetaminophen-caffeine (EXCEDRIN EXTRA STRENGTH) 250-250-65 MG tablet, Take 2 tablets by mouth every 6 (six) hours as needed for headache., Disp: , Rfl:  cetirizine (ZYRTEC ALLERGY) 10 MG tablet, Take 1 tablet (10 mg total) by mouth daily. (Patient taking differently: Take 10 mg by mouth daily as needed for allergies.), Disp: 30 tablet, Rfl: 1 HYDROcodone-acetaminophen (NORCO/VICODIN) 5-325 MG tablet, Take 1 tablet by mouth every 6 (six) hours as needed., Disp: 15 tablet, Rfl: 0 ibuprofen (ADVIL) 200 MG tablet, Take 800 mg by mouth every 6 (six) hours as needed for moderate pain., Disp: , Rfl:  ketorolac (TORADOL) 10 MG tablet, Take 1 tablet (10 mg total) by mouth every 8 (eight) hours as needed., Disp: 15 tablet, Rfl: 0 ketorolac (TORADOL) 10 MG tablet, Take 1 tablet (10 mg total) by mouth every 8 (eight) hours as needed., Disp: 15 tablet, Rfl: 0 ondansetron (ZOFRAN-ODT) 8 MG disintegrating tablet, Take 1 tablet (8 mg total) by mouth every 8 (eight) hours as needed for nausea or vomiting., Disp: 8 tablet, Rfl: 0 oxyCODONE-acetaminophen (PERCOCET) 5-325 MG tablet, Take 1 tablet by mouth every 6 (six) hours as needed for severe pain., Disp: 18 tablet, Rfl: 0 medroxyPROGESTERone Acetate 150 MG/ML SUSY, Inject 150 mg into the muscle every 3 months (Patient not taking: Reported on 05/20/2022), Disp: 1 mL, Rfl: 1  No current facility-administered medications for this visit.    There were no vitals taken for this visit.  Physical Exam: Gen WDWN NAD  Diagnostic  Tests:   Pathology: benign  Impression + Management plan:   ICD-10-CM   1. Post-operative state  Z98.890      Normal post op course    Medications Prescribed this encounter: No orders of the defined types were placed in this encounter.     Follow up: No follow-ups on file.    Lazaro Arms, MD Attending Physician for the Center for [facility] and [facility] 05/20/2022 12:13 PM

## 2022-07-28 ENCOUNTER — Encounter: Payer: Self-pay | Admitting: Radiology

## 2022-10-20 ENCOUNTER — Other Ambulatory Visit (HOSPITAL_COMMUNITY): Payer: Self-pay | Admitting: Obstetrics & Gynecology

## 2022-10-20 DIAGNOSIS — N6310 Unspecified lump in the right breast, unspecified quadrant: Secondary | ICD-10-CM

## 2022-11-22 ENCOUNTER — Ambulatory Visit (HOSPITAL_COMMUNITY)
Admission: RE | Admit: 2022-11-22 | Discharge: 2022-11-22 | Disposition: A | Discharge status: Home / Self Care | Payer: Commercial Managed Care - PPO | Source: Ambulatory Visit | Attending: Obstetrics & Gynecology | Admitting: Obstetrics & Gynecology

## 2022-11-22 ENCOUNTER — Encounter (HOSPITAL_COMMUNITY): Payer: Self-pay

## 2022-11-22 DIAGNOSIS — R92321 Mammographic fibroglandular density, right breast: Secondary | ICD-10-CM | POA: Diagnosis not present

## 2022-11-22 DIAGNOSIS — N6310 Unspecified lump in the right breast, unspecified quadrant: Secondary | ICD-10-CM | POA: Diagnosis not present

## 2022-11-23 ENCOUNTER — Encounter: Payer: Self-pay | Admitting: Internal Medicine

## 2022-11-23 ENCOUNTER — Ambulatory Visit: Payer: Commercial Managed Care - PPO | Admitting: Internal Medicine

## 2022-11-23 VITALS — BP 124/82 | HR 82 | Ht 63.0 in | Wt 165.0 lb

## 2022-11-23 DIAGNOSIS — Z1321 Encounter for screening for nutritional disorder: Secondary | ICD-10-CM | POA: Diagnosis not present

## 2022-11-23 DIAGNOSIS — Z1329 Encounter for screening for other suspected endocrine disorder: Secondary | ICD-10-CM

## 2022-11-23 DIAGNOSIS — Z13220 Encounter for screening for lipoid disorders: Secondary | ICD-10-CM | POA: Diagnosis not present

## 2022-11-23 DIAGNOSIS — Z1159 Encounter for screening for other viral diseases: Secondary | ICD-10-CM | POA: Diagnosis not present

## 2022-11-23 DIAGNOSIS — Z0001 Encounter for general adult medical examination with abnormal findings: Secondary | ICD-10-CM | POA: Diagnosis not present

## 2022-11-23 DIAGNOSIS — Z131 Encounter for screening for diabetes mellitus: Secondary | ICD-10-CM | POA: Diagnosis not present

## 2022-11-23 DIAGNOSIS — Z720 Tobacco use: Secondary | ICD-10-CM

## 2022-11-23 NOTE — Assessment & Plan Note (Signed)
She currently smokes 0.5 packs/day and has been smoking since age 43.  She is interested in cessation but has not selected a quit date.  Reports that she previously quit for 6 months using nicotine replacement therapy. -The patient was counseled on the dangers of tobacco use, and was advised to quit.  Reviewed strategies to maximize success, including removing cigarettes and smoking materials from environment, stress management, substitution of other forms of reinforcement, support of family/friends, and written materials. -Smoking cessation was strongly encouraged.  This will contact our office when she is ready to quit.  We are happy to help however needed.

## 2022-11-23 NOTE — Patient Instructions (Signed)
It was a pleasure to see you today.  Thank you for giving [place] the opportunity to be involved in your care.  Below is a brief recap of your visit and next steps.  We will plan to see you again in 1 year.  Summary You have established care today. We will check basic labs I recommend stopping smoking. As discussed, please let me know how I can help. Follow up in 1 year for annual exam

## 2022-11-23 NOTE — Progress Notes (Signed)
New Patient Office Visit  Subjective    Patient ID: Wendy Powell, female    DOB: Apr 13, 1981  Age: 42 y.o. MRN: [id number]  CC:  Chief Complaint  Patient presents with   Establish Care    HPI Wendy Powell presents to establish care.  She is a 42 year old woman with no significant past medical history.  She has not had a PCP in many years.  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  She endorses current tobacco use, smoking 0.5 packs/day since age 50.  She endorses occasional alcohol consumption but denies illicit drug use.  Her family medical history is significant for diabetes mellitus, Hodgkin's lymphoma, and ovarian cancer.  Wendy Powell reports feeling well today.  She is asymptomatic and has no acute concerns to discuss aside from desiring to establish care.   Outpatient Encounter Medications as of 11/23/2022  Medication Sig   [DISCONTINUED] aspirin-acetaminophen-caffeine (EXCEDRIN EXTRA STRENGTH) 250-250-65 MG tablet Take 2 tablets by mouth every 6 (six) hours as needed for headache.   [DISCONTINUED] cetirizine (ZYRTEC ALLERGY) 10 MG tablet Take 1 tablet (10 mg total) by mouth daily. (Patient taking differently: Take 10 mg by mouth daily as needed for allergies.)   [DISCONTINUED] HYDROcodone-acetaminophen (NORCO/VICODIN) 5-325 MG tablet Take 1 tablet by mouth every 6 (six) hours as needed.   [DISCONTINUED] ibuprofen (ADVIL) 200 MG tablet Take 800 mg by mouth every 6 (six) hours as needed for moderate pain.   [DISCONTINUED] ketorolac (TORADOL) 10 MG tablet Take 1 tablet (10 mg total) by mouth every 8 (eight) hours as needed.   [DISCONTINUED] ketorolac (TORADOL) 10 MG tablet Take 1 tablet (10 mg total) by mouth every 8 (eight) hours as needed.   [DISCONTINUED] medroxyPROGESTERone Acetate 150 MG/ML SUSY Inject 150 mg into the muscle every 3 months (Patient not taking: Reported on 05/20/2022)   [DISCONTINUED] ondansetron (ZOFRAN-ODT) 8 MG disintegrating tablet  Take 1 tablet (8 mg total) by mouth every 8 (eight) hours as needed for nausea or vomiting.   [DISCONTINUED] oxyCODONE-acetaminophen (PERCOCET) 5-325 MG tablet Take 1 tablet by mouth every 6 (six) hours as needed for severe pain.   No facility-administered encounter medications on file as of 11/23/2022.    Past Medical History:  Diagnosis Date   History of kidney stones     Past Surgical History:  Procedure Laterality Date   CESAREAN SECTION     LAPAROSCOPIC UNILATERAL SALPINGECTOMY Right 05/11/2022   Procedure: LAPAROSCOPIC UNILATERAL SALPINGECTOMY;  Surgeon: Lazaro Arms, MD;  Location: [facility];  Service: Gynecology;  Laterality: Right;   LAPAROSCOPIC UNILATERAL SALPINGO OOPHERECTOMY Left 05/11/2022   Procedure: LAPAROSCOPIC UNILATERAL SALPINGO OOPHORECTOMY;  Surgeon: Lazaro Arms, MD;  Location: [facility];  Service: Gynecology;  Laterality: Left;    History reviewed. No pertinent family history.  Social History   Socioeconomic History   Marital status: Single    Spouse name: Not on file   Number of children: Not on file   Years of education: Not on file   Highest education level: Not on file  Occupational History   Not on file  Tobacco Use   Smoking status: Every Day    Packs/day: 0.50    Years: 17.00    Additional pack years: 0.00    Total pack years: 8.50    Types: Cigarettes   Smokeless tobacco: Never  Substance and Sexual Activity   Alcohol use: Yes    Comment: occ   Drug use: No  Sexual activity: Yes    Birth control/protection: Surgical    Comment: tubal  Other Topics Concern   Not on file  Social History Narrative   Not on file   Social Determinants of Health   Financial Resource Strain: Not on file  Food Insecurity: Not on file  Transportation Needs: Not on file  Physical Activity: Not on file  Stress: Not on file  Social Connections: Not on file  Intimate Partner Violence: Not on file   Review of Systems  Constitutional:  Negative for chills  and fever.  HENT:  Negative for sore throat.   Respiratory:  Negative for cough and shortness of breath.   Cardiovascular:  Negative for chest pain, palpitations and leg swelling.  Gastrointestinal:  Negative for abdominal pain, blood in stool, constipation, diarrhea, nausea and vomiting.  Genitourinary:  Negative for dysuria and hematuria.  Musculoskeletal:  Negative for myalgias.  Skin:  Negative for itching and rash.  Neurological:  Negative for dizziness and headaches.  Psychiatric/Behavioral:  Negative for depression and suicidal ideas.    Objective    BP 124/82   Pulse 82   Ht 5\' 3"  (1.6 m)   Wt 165 lb (74.8 kg)   LMP 11/08/2022   SpO2 98%   BMI 29.23 kg/m   Physical Exam Vitals reviewed.  Constitutional:      General: She is not in acute distress.    Appearance: Normal appearance. She is not toxic-appearing.  HENT:     Head: Normocephalic and atraumatic.     Right Ear: External ear normal.     Left Ear: External ear normal.     Nose: Nose normal. No congestion or rhinorrhea.     Mouth/Throat:     Mouth: Mucous membranes are moist.     Pharynx: Oropharynx is clear. No oropharyngeal exudate or posterior oropharyngeal erythema.  Eyes:     General: No scleral icterus.    Extraocular Movements: Extraocular movements intact.     Conjunctiva/sclera: Conjunctivae normal.     Pupils: Pupils are equal, round, and reactive to light.  Cardiovascular:     Rate and Rhythm: Normal rate and regular rhythm.     Pulses: Normal pulses.     Heart sounds: Normal heart sounds. No murmur heard.    No friction rub. No gallop.  Pulmonary:     Effort: Pulmonary effort is normal.     Breath sounds: Normal breath sounds. No wheezing, rhonchi or rales.  Abdominal:     General: Abdomen is flat. Bowel sounds are normal. There is no distension.     Palpations: Abdomen is soft.     Tenderness: There is no abdominal tenderness.  Musculoskeletal:        General: No swelling. Normal range of  motion.     Cervical back: Normal range of motion.     Right lower leg: No edema.     Left lower leg: No edema.  Lymphadenopathy:     Cervical: No cervical adenopathy.  Skin:    General: Skin is warm and dry.     Capillary Refill: Capillary refill takes less than 2 seconds.     Coloration: Skin is not jaundiced.  Neurological:     General: No focal deficit present.     Mental Status: She is alert and oriented to person, place, and time.  Psychiatric:        Mood and Affect: Mood normal.        Behavior: Behavior normal.  Assessment & Plan:   Problem List Items Addressed This Visit       Current tobacco use    She currently smokes 0.5 packs/day and has been smoking since age 42.  She is interested in cessation but has not selected a quit date.  Reports that she previously quit for 6 months using nicotine replacement therapy. -The patient was counseled on the dangers of tobacco use, and was advised to quit.  Reviewed strategies to maximize success, including removing cigarettes and smoking materials from environment, stress management, substitution of other forms of reinforcement, support of family/friends, and written materials. -Smoking cessation was strongly encouraged.  This will contact our office when she is ready to quit.  We are happy to help however needed.      Encounter for well adult exam with abnormal findings - Primary    Presenting today to establish care.  Available records and labs have been reviewed. -Baseline labs ordered today, including one-time HCV screening -Vaccinations are up-to-date -We will tentatively plan for follow-up in 1 year for annual exam      Return in about 1 year (around 11/23/2023) for CPE.   Billie Lade, MD

## 2022-11-23 NOTE — Assessment & Plan Note (Signed)
Presenting today to establish care.  Available records and labs have been reviewed. -Baseline labs ordered today, including one-time HCV screening -Vaccinations are up-to-date -We will tentatively plan for follow-up in 1 year for annual exam

## 2022-11-24 ENCOUNTER — Other Ambulatory Visit: Payer: Self-pay | Admitting: Internal Medicine

## 2022-11-24 DIAGNOSIS — E559 Vitamin D deficiency, unspecified: Secondary | ICD-10-CM

## 2022-11-24 LAB — CMP14+EGFR
ALT: 13 IU/L (ref 0–32)
AST: 20 IU/L (ref 0–40)
Albumin: 4.4 g/dL (ref 3.9–4.9)
Alkaline Phosphatase: 89 IU/L (ref 44–121)
BUN/Creatinine Ratio: 24 — ABNORMAL HIGH (ref 9–23)
BUN: 19 mg/dL (ref 6–24)
Bilirubin Total: <0.2 mg/dL (ref 0.0–1.2)
CO2: 21 mmol/L (ref 20–29)
Calcium: 9.4 mg/dL (ref 8.7–10.2)
Chloride: 107 mmol/L — ABNORMAL HIGH (ref 96–106)
Creatinine, Ser: 0.78 mg/dL (ref 0.57–1.00)
Globulin, Total: 2.1 g/dL (ref 1.5–4.5)
Glucose: 91 mg/dL (ref 70–99)
Potassium: 4.5 mmol/L (ref 3.5–5.2)
Sodium: 142 mmol/L (ref 134–144)
Total Protein: 6.5 g/dL (ref 6.0–8.5)
eGFR: 98 mL/min/{1.73_m2} (ref >59)

## 2022-11-24 LAB — LIPID PANEL
Chol/HDL Ratio: 3.2 ratio (ref 0.0–4.4)
Cholesterol, Total: 154 mg/dL (ref 100–199)
HDL: 48 mg/dL (ref >39)
LDL Chol Calc (NIH): 85 mg/dL (ref 0–99)
Triglycerides: 116 mg/dL (ref 0–149)
VLDL Cholesterol Cal: 21 mg/dL (ref 5–40)

## 2022-11-24 LAB — CBC WITH DIFFERENTIAL/PLATELET
Basophils Absolute: 0 10*3/uL (ref 0.0–0.2)
Basos: 1 %
EOS (ABSOLUTE): 0.2 10*3/uL (ref 0.0–0.4)
Eos: 3 %
Hematocrit: 38.1 % (ref 34.0–46.6)
Hemoglobin: 12.4 g/dL (ref 11.1–15.9)
Immature Grans (Abs): 0 10*3/uL (ref 0.0–0.1)
Immature Granulocytes: 0 %
Lymphocytes Absolute: 3 10*3/uL (ref 0.7–3.1)
Lymphs: 40 %
MCH: 30 pg (ref 26.6–33.0)
MCHC: 32.5 g/dL (ref 31.5–35.7)
MCV: 92 fL (ref 79–97)
Monocytes Absolute: 0.5 10*3/uL (ref 0.1–0.9)
Monocytes: 6 %
Neutrophils Absolute: 3.8 10*3/uL (ref 1.4–7.0)
Neutrophils: 50 %
Platelets: 279 10*3/uL (ref 150–450)
RBC: 4.13 x10E6/uL (ref 3.77–5.28)
RDW: 12.9 % (ref 11.7–15.4)
WBC: 7.6 10*3/uL (ref 3.4–10.8)

## 2022-11-24 LAB — B12 AND FOLATE PANEL
Folate: 8.4 ng/mL (ref >3.0)
Vitamin B-12: 444 pg/mL (ref 232–1245)

## 2022-11-24 LAB — VITAMIN D 25 HYDROXY (VIT D DEFICIENCY, FRACTURES): Vit D, 25-Hydroxy: 16.2 ng/mL — ABNORMAL LOW (ref 30.0–100.0)

## 2022-11-24 LAB — HCV INTERPRETATION

## 2022-11-24 LAB — HEMOGLOBIN A1C
Est. average glucose Bld gHb Est-mCnc: 117 mg/dL
Hgb A1c MFr Bld: 5.7 % — ABNORMAL HIGH (ref 4.8–5.6)

## 2022-11-24 LAB — HCV AB W REFLEX TO QUANT PCR: HCV Ab: NONREACTIVE

## 2022-11-24 LAB — TSH+FREE T4
Free T4: 1.16 ng/dL (ref 0.82–1.77)
TSH: 1.92 u[IU]/mL (ref 0.450–4.500)

## 2022-11-24 MED ORDER — VITAMIN D (ERGOCALCIFEROL) 1.25 MG (50000 UNIT) PO CAPS: 50000.0000 [IU] | ORAL_CAPSULE | ORAL | 0 refills | Status: AC

## 2022-12-06 ENCOUNTER — Ambulatory Visit (INDEPENDENT_AMBULATORY_CARE_PROVIDER_SITE_OTHER): Payer: Commercial Managed Care - PPO | Admitting: Obstetrics & Gynecology

## 2022-12-06 ENCOUNTER — Other Ambulatory Visit (HOSPITAL_COMMUNITY)
Admission: RE | Admit: 2022-12-06 | Discharge: 2022-12-06 | Disposition: A | Discharge status: Home / Self Care | Payer: Commercial Managed Care - PPO | Source: Ambulatory Visit | Attending: Obstetrics & Gynecology | Admitting: Obstetrics & Gynecology

## 2022-12-06 ENCOUNTER — Encounter: Payer: Self-pay | Admitting: Obstetrics & Gynecology

## 2022-12-06 VITALS — BP 115/81 | HR 78 | Ht 63.0 in | Wt 164.0 lb

## 2022-12-06 DIAGNOSIS — N951 Menopausal and female climacteric states: Secondary | ICD-10-CM

## 2022-12-06 DIAGNOSIS — Z01419 Encounter for gynecological examination (general) (routine) without abnormal findings: Secondary | ICD-10-CM | POA: Insufficient documentation

## 2022-12-06 NOTE — Progress Notes (Signed)
Subjective:     Wendy Powell is a 42 y.o. female here for a routine exam.  Patient's last menstrual period was 11/08/2022. G2P2 Birth Control Method:  BTL Menstrual Calendar(currently): spotting  Current complaints: vasomotor symptoms.   Current acute medical issues:  none   Recent Gynecologic History Patient's last menstrual period was 11/08/2022. Last Pap: 07/05/2021,  normal Last mammogram: 10/2022,  benign cyst  Past Medical History:  Diagnosis Date   History of kidney stones    Vaginal Pap smear, abnormal     Past Surgical History:  Procedure Laterality Date   CESAREAN SECTION     LAPAROSCOPIC UNILATERAL SALPINGECTOMY Right 05/11/2022   Procedure: LAPAROSCOPIC UNILATERAL SALPINGECTOMY;  Surgeon: Lazaro Arms, MD;  Location: [facility];  Service: Gynecology;  Laterality: Right;   LAPAROSCOPIC UNILATERAL SALPINGO OOPHERECTOMY Left 05/11/2022   Procedure: LAPAROSCOPIC UNILATERAL SALPINGO OOPHORECTOMY;  Surgeon: Lazaro Arms, MD;  Location: [facility];  Service: Gynecology;  Laterality: Left;   LEEP      OB History     Gravida  2   Para      Term      Preterm      AB      Living  2      SAB      IAB      Ectopic      Multiple      Live Births              Social History   Socioeconomic History   Marital status: Single    Spouse name: Not on file   Number of children: Not on file   Years of education: Not on file   Highest education level: Not on file  Occupational History   Not on file  Tobacco Use   Smoking status: Every Day    Packs/day: 0.50    Years: 17.00    Additional pack years: 0.00    Total pack years: 8.50    Types: Cigarettes   Smokeless tobacco: Never  Vaping Use   Vaping Use: Never used  Substance and Sexual Activity   Alcohol use: Yes    Comment: occ   Drug use: No   Sexual activity: Yes    Birth control/protection: Surgical    Comment: tubal  Other Topics Concern   Not on file  Social History Narrative   Not on file    Social Determinants of Health   Financial Resource Strain: Low Risk  (12/06/2022)   Overall Financial Resource Strain (CARDIA)    Difficulty of Paying Living Expenses: Not hard at all  Food Insecurity: No Food Insecurity (12/06/2022)   Hunger Vital Sign    Worried About Running Out of Food in the Last Year: Never true    Ran Out of Food in the Last Year: Never true  Transportation Needs: No Transportation Needs (12/06/2022)   PRAPARE - Administrator, Civil Service (Medical): No    Lack of Transportation (Non-Medical): No  Physical Activity: Inactive (12/06/2022)   Exercise Vital Sign    Days of Exercise per Week: 0 days    Minutes of Exercise per Session: 0 min  Stress: No Stress Concern Present (12/06/2022)   [employer] of Occupational Health - Occupational Stress Questionnaire    Feeling of Stress : Only a little  Social Connections: Moderately Isolated (12/06/2022)   Social Connection and Isolation Panel [NHANES]    Frequency of Communication with Friends and Family: More than  three times a week    Frequency of Social Gatherings with Friends and Family: Once a week    Attends Religious Services: Never    Database administrator or Organizations: No    Attends Engineer, structural: Never    Marital Status: Married    Family History  Problem Relation Age of Onset   Diabetes Father      Current Outpatient Medications:    Vitamin D, Ergocalciferol, (DRISDOL) 1.25 MG (50000 UNIT) CAPS capsule, Take 1 capsule (50,000 Units total) by mouth every 7 (seven) days for 12 doses., Disp: 12 capsule, Rfl: 0  Review of Systems  Review of Systems  Constitutional: Negative for fever, chills, weight loss, malaise/fatigue and diaphoresis.  HENT: Negative for hearing loss, ear pain, nosebleeds, congestion, sore throat, neck pain, tinnitus and ear discharge.   Eyes: Negative for blurred vision, double vision, photophobia, pain, discharge and redness.  Respiratory:  Negative for cough, hemoptysis, sputum production, shortness of breath, wheezing and stridor.   Cardiovascular: Negative for chest pain, palpitations, orthopnea, claudication, leg swelling and PND.  Gastrointestinal: negative for abdominal pain. Negative for heartburn, nausea, vomiting, diarrhea, constipation, blood in stool and melena.  Genitourinary: Negative for dysuria, urgency, frequency, hematuria and flank pain.  Musculoskeletal: Negative for myalgias, back pain, joint pain and falls.  Skin: Negative for itching and rash.  Neurological: Negative for dizziness, tingling, tremors, sensory change, speech change, focal weakness, seizures, loss of consciousness, weakness and headaches.  Endo/Heme/Allergies: Negative for environmental allergies and polydipsia. Does not bruise/bleed easily.  Psychiatric/Behavioral: Negative for depression, suicidal ideas, hallucinations, memory loss and substance abuse. The patient is not nervous/anxious and does not have insomnia.        Objective:  Blood pressure 115/81, pulse 78, height 5\' 3"  (1.6 m), weight 164 lb (74.4 kg), last menstrual period 11/08/2022.   Physical Exam  Vitals reviewed. Constitutional: She is oriented to person, place, and time. She appears well-developed and well-nourished.  HENT:  Head: Normocephalic and atraumatic.        Right Ear: External ear normal.  Left Ear: External ear normal.  Nose: Nose normal.  Mouth/Throat: Oropharynx is clear and moist.  Eyes: Conjunctivae and EOM are normal. Pupils are equal, round, and reactive to light. Right eye exhibits no discharge. Left eye exhibits no discharge. No scleral icterus.  Neck: Normal range of motion. Neck supple. No tracheal deviation present. No thyromegaly present.  Cardiovascular: Normal rate, regular rhythm, normal heart sounds and intact distal pulses.  Exam reveals no gallop and no friction rub.   No murmur heard. Respiratory: Effort normal and breath sounds normal. No  respiratory distress. She has no wheezes. She has no rales. She exhibits no tenderness.  GI: Soft. Bowel sounds are normal. She exhibits no distension and no mass. There is no tenderness. There is no rebound and no guarding.  Genitourinary:  Breasts no masses skin changes or nipple changes bilaterally      Vulva is normal without lesions Vagina is pink moist without discharge Cervix normal in appearance and pap is done Uterus is normal size shape and contour Adnexa is negative with normal sized ovaries   Musculoskeletal: Normal range of motion. She exhibits no edema and no tenderness.  Neurological: She is alert and oriented to person, place, and time. She has normal reflexes. She displays normal reflexes. No cranial nerve deficit. She exhibits normal muscle tone. Coordination normal.  Skin: Skin is warm and dry. No rash noted. No erythema. No pallor.  Psychiatric: She has a normal mood and affect. Her behavior is normal. Judgment and thought content normal.       Medications Ordered at today's visit: No orders of the defined types were placed in this encounter.   Other orders placed at today's visit: Orders Placed This Encounter  Procedures   Thyroid Panel With TSH   [facility]      Assessment:    Normal Gyn exam.   Vasomotor symptoms with spotting Plan:    Contraception: tubal ligation. Follow up in: 3 years.     No follow-ups on file.

## 2022-12-06 NOTE — Addendum Note (Signed)
Addended by: Moss Mc on: 12/06/2022 04:48 PM   Modules accepted: Orders

## 2022-12-07 ENCOUNTER — Other Ambulatory Visit (HOSPITAL_COMMUNITY): Payer: Self-pay

## 2022-12-07 ENCOUNTER — Other Ambulatory Visit: Payer: Self-pay | Admitting: Obstetrics & Gynecology

## 2022-12-07 ENCOUNTER — Other Ambulatory Visit (HOSPITAL_BASED_OUTPATIENT_CLINIC_OR_DEPARTMENT_OTHER): Payer: Self-pay

## 2022-12-07 ENCOUNTER — Other Ambulatory Visit: Payer: Self-pay

## 2022-12-07 LAB — FOLLICLE STIMULATING HORMONE: FSH: 58.8 m[IU]/mL

## 2022-12-07 LAB — THYROID PANEL WITH TSH
Free Thyroxine Index: 1.7 (ref 1.2–4.9)
T3 Uptake Ratio: 23 % — ABNORMAL LOW (ref 24–39)
T4, Total: 7.6 ug/dL (ref 4.5–12.0)
TSH: 2.04 u[IU]/mL (ref 0.450–4.500)

## 2022-12-07 MED ORDER — CONJ ESTROGENS-BAZEDOXIFENE 0.45-20 MG PO TABS
1.0000 | ORAL_TABLET | Freq: Every day | ORAL | 3 refills | Status: DC
  Filled 2022-12-07 – 2023-05-01 (×3): qty 90, 90d supply, fill #0
  Filled 2023-07-26 (×2): qty 90, 90d supply, fill #1
  Filled 2023-10-30: qty 90, 90d supply, fill #2

## 2022-12-08 ENCOUNTER — Other Ambulatory Visit (HOSPITAL_COMMUNITY): Payer: Self-pay

## 2022-12-08 ENCOUNTER — Other Ambulatory Visit: Payer: Self-pay

## 2022-12-09 ENCOUNTER — Other Ambulatory Visit: Payer: Self-pay

## 2022-12-09 LAB — CYTOLOGY - PAP
Comment: NEGATIVE
Diagnosis: NEGATIVE
Diagnosis: REACTIVE
High risk HPV: NEGATIVE

## 2023-01-06 DIAGNOSIS — H524 Presbyopia: Secondary | ICD-10-CM | POA: Diagnosis not present

## 2023-01-06 DIAGNOSIS — H43393 Other vitreous opacities, bilateral: Secondary | ICD-10-CM | POA: Diagnosis not present

## 2023-02-14 ENCOUNTER — Other Ambulatory Visit: Payer: Self-pay | Admitting: Internal Medicine

## 2023-02-14 DIAGNOSIS — E559 Vitamin D deficiency, unspecified: Secondary | ICD-10-CM

## 2023-03-06 DIAGNOSIS — H5789 Other specified disorders of eye and adnexa: Secondary | ICD-10-CM | POA: Diagnosis not present

## 2023-03-08 IMAGING — CT CT L SPINE W/O CM
3 series · 11 of 33 positions shown, 13 images · non-contrast
Comparison: None.

CLINICAL DATA: Back pain.

EXAM:
CT LUMBAR SPINE WITHOUT CONTRAST
TECHNIQUE: Reformats of the lumbar spine were requested an generated from a
noncontrast abdomen and pelvis CT.

[Series 9: thins · coronal · 0.28mm/px · 3 of 162 slices shown (1 of 3)]
[im 33/162  bone]
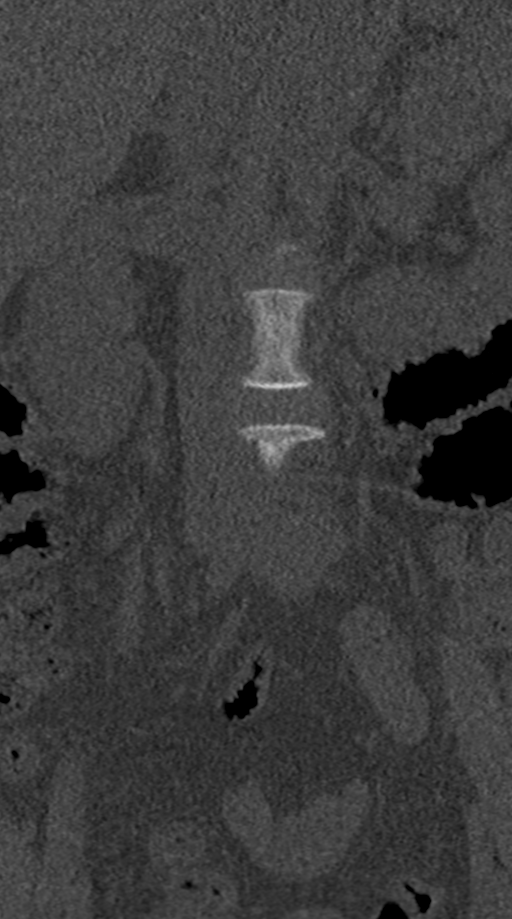
[im 65/162  bone]
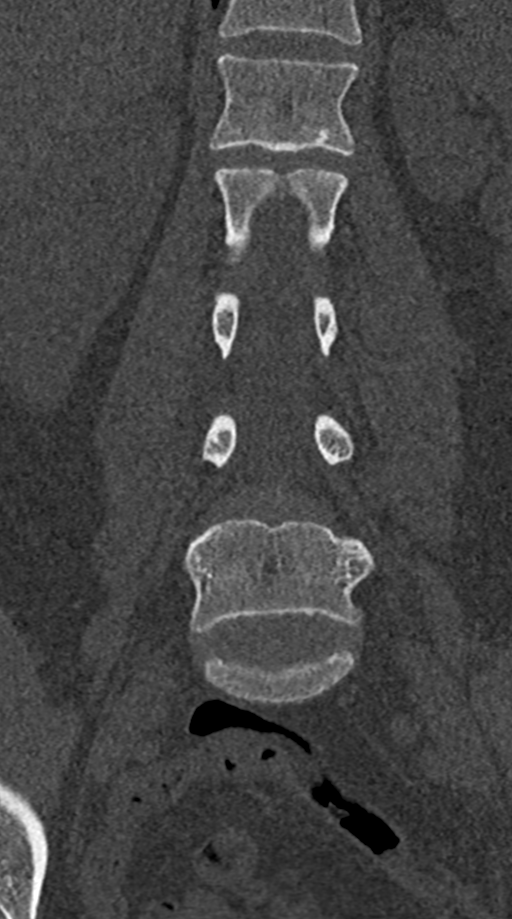
[im 97/162  bone]
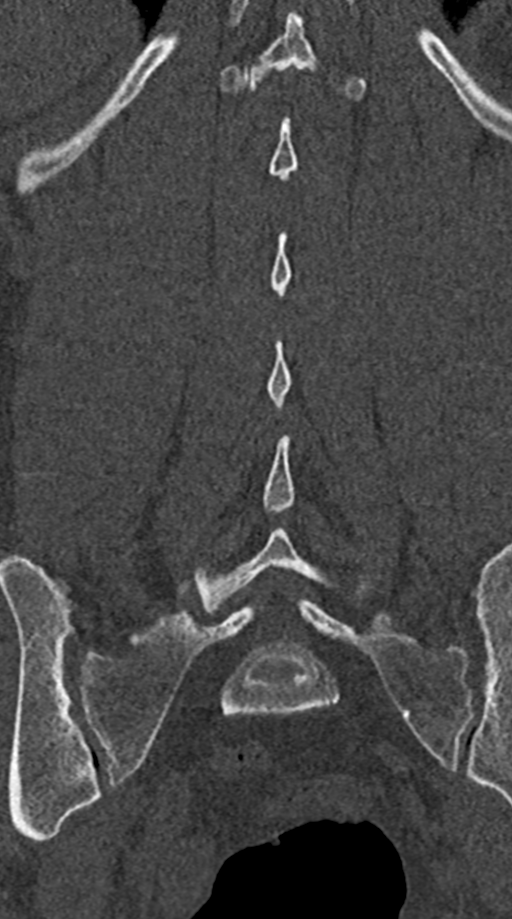

[Series 10: thins · sagittal · 0.32mm/px · 5 of 291 slices shown, 6 images (2 of 3)]
[im 97/291  bone]
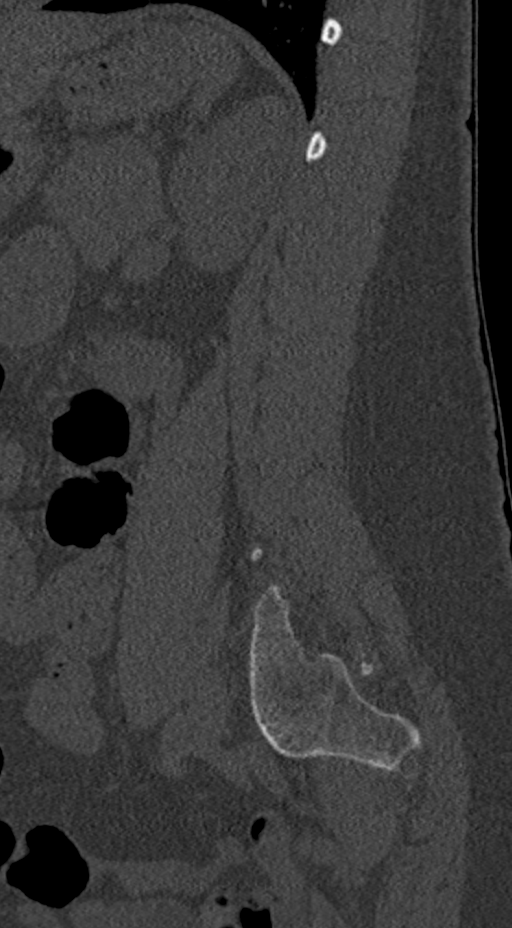
[im 121/291  bone]
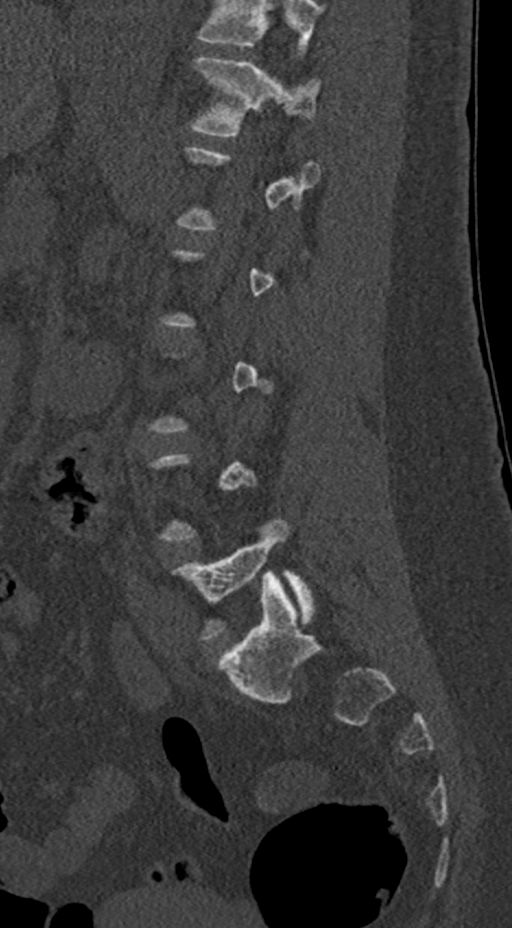
[im 146/291  soft-tissue]
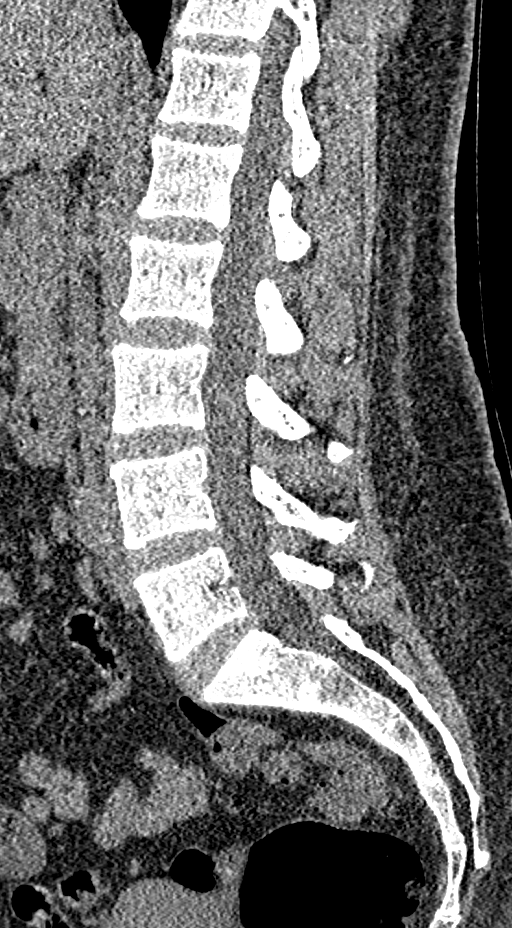
[im 146/291  bone]
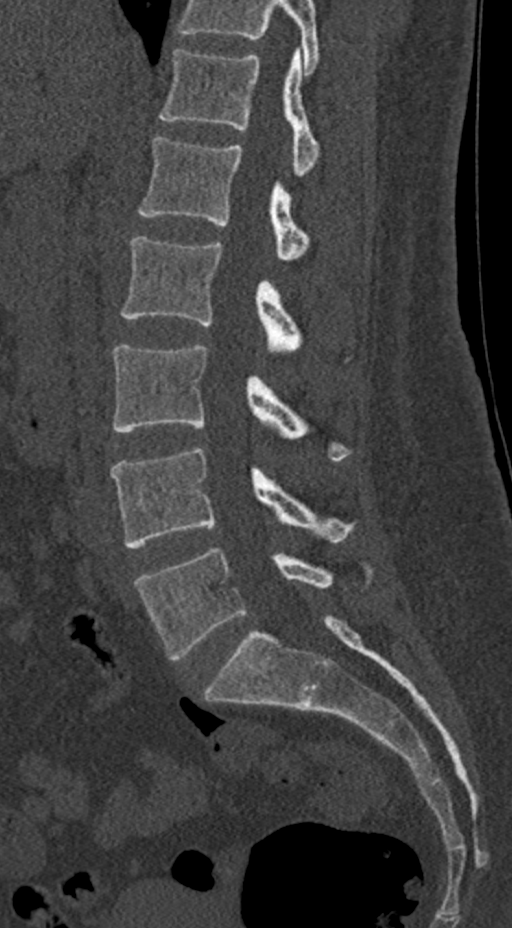
[im 170/291  bone]
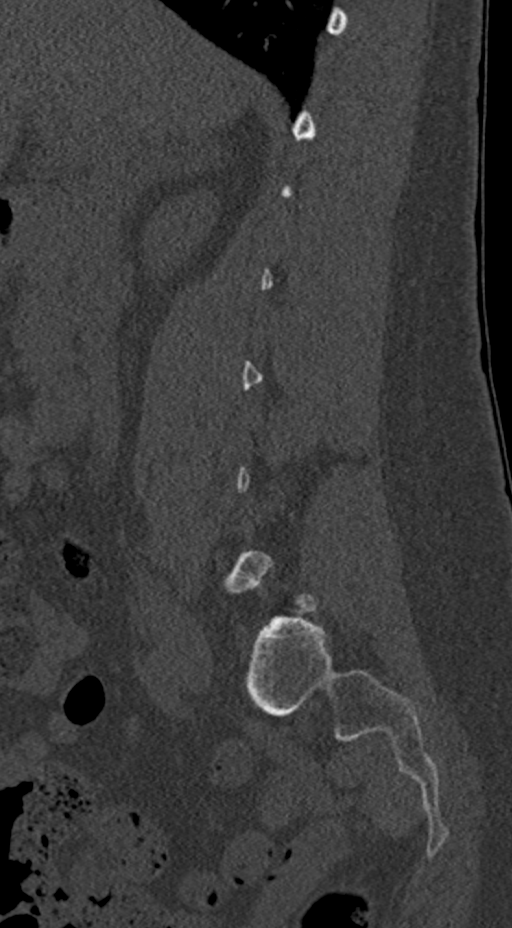
[im 194/291  bone]
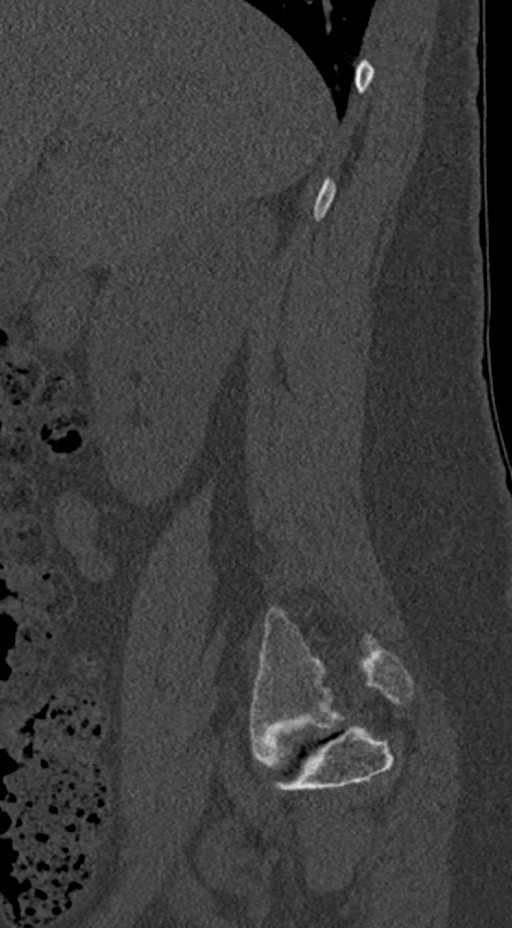

[Series 11: thins · axial · 0.30mm/px · z∈[+1151,+1351]mm · 3 of 326 slices shown, 4 images (3 of 3)]
[im 76/326  soft-tissue]
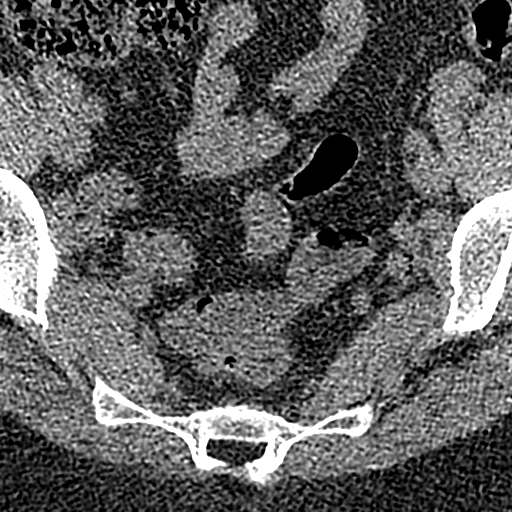
[im 76/326  bone]
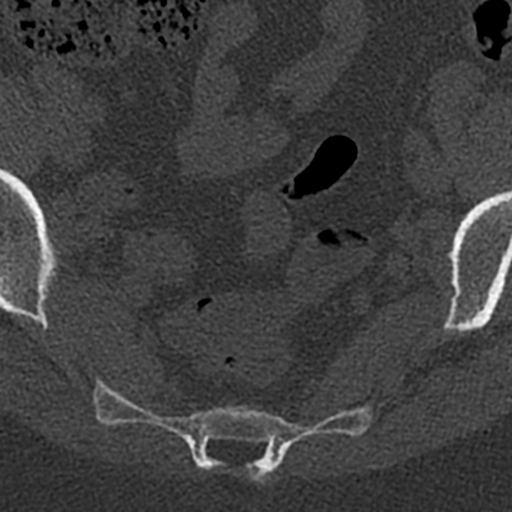
[im 176/326  bone]
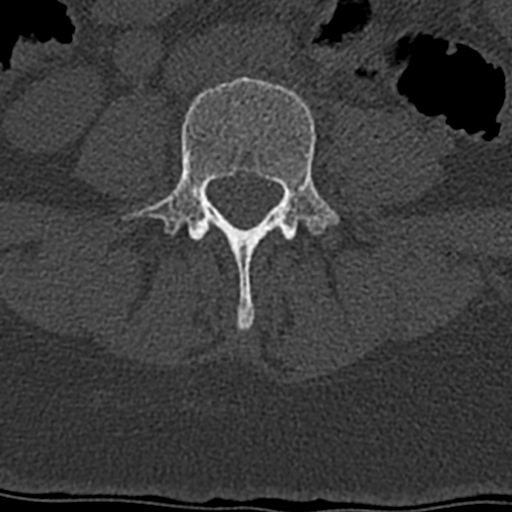
[im 276/326  bone]
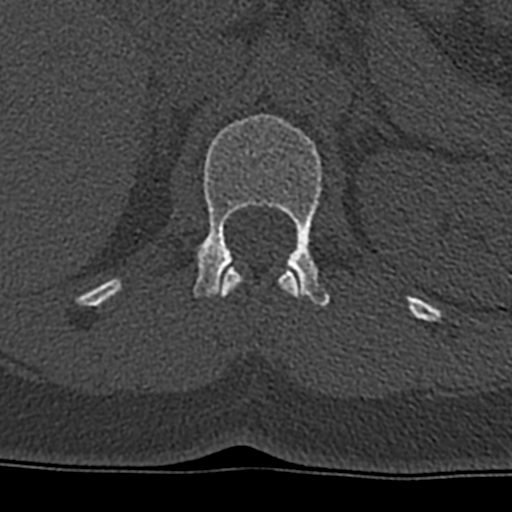

[11 of 33 positions shown; findings below may reference images not displayed]

FINDINGS: Segmentation: 5 lumbar type vertebrae.

Alignment: Normal.

Vertebrae: No acute fracture or focal pathologic process.

Paraspinal and other soft tissues: Negative.

Disc levels: No degenerative changes or visible impingement
IMPRESSION: Normal lumbar spine reformats.

## 2023-04-06 DIAGNOSIS — H5789 Other specified disorders of eye and adnexa: Secondary | ICD-10-CM | POA: Diagnosis not present

## 2023-05-01 ENCOUNTER — Other Ambulatory Visit (HOSPITAL_COMMUNITY): Payer: Self-pay

## 2023-05-02 ENCOUNTER — Other Ambulatory Visit: Payer: Self-pay

## 2023-05-03 ENCOUNTER — Encounter: Payer: Self-pay | Admitting: Internal Medicine

## 2023-05-03 ENCOUNTER — Other Ambulatory Visit (HOSPITAL_COMMUNITY): Payer: Self-pay

## 2023-05-04 ENCOUNTER — Other Ambulatory Visit (HOSPITAL_COMMUNITY): Payer: Self-pay

## 2023-05-04 ENCOUNTER — Other Ambulatory Visit: Payer: Self-pay

## 2023-05-04 MED ORDER — XIIDRA 5 % OP SOLN
1.0000 [drp] | Freq: Two times a day (BID) | OPHTHALMIC | 1 refills | Status: DC
  Filled 2023-05-04: qty 60, 30d supply, fill #0

## 2023-05-17 NOTE — Patient Instructions (Signed)

## 2023-05-17 NOTE — Progress Notes (Unsigned)
   Established Patient Office Visit   Subjective  Patient ID: Wendy Powell, female    DOB: 07/08/1980  Age: 42 y.o. MRN: [id number]  No chief complaint on file.   She  has a past medical history of History of kidney stones and Vaginal Pap smear, abnormal.  Obesity: Patient complains of obesity. Patient cites {motive:16538} as reasons for wanting to lose weight.  Obesity History Weight in late teens: *** lb. Period of greatest weight gain: *** lb during {age at onset:[age]} Lowest adult weight: *** Highest adult weight: *** Amount of time at present weight: *** lb.   History of Weight Loss Efforts Greatest amount of weight lost: *** lb over *** months Amount of time that loss was maintained: *** months Circumstances associated with regain of weight: *** Successful weight loss techniques attempted: {obesity treatment:12513} Unsuccessful weight loss techniques attempted: {obesity treatment:12513}  Current Exercise Habits {exercise types:16438}  Current Eating Habits Number of regular meals per day: {0-10:33138} Number of snacking episodes per day: {0-10:33138} Who shops for food? {companion:5061::"patient"} Who prepares food? {companion:5061::"patient"} Who eats with patient? {companion:5061::"patient"} Binge behavior?: {yes***/no:17258} Purge behavior? {yes***/no:17258} Anorexic behavior? {yes***/no:17258} Eating precipitated by stress? {yes***/no:17258} Guilt feelings associated with eating? {yes***/no:17258}  Other Potential Contributing Factors Use of alcohol: average *** drinks/week Use of medications that may cause weight gain {meds:16753} History of past abuse? {abuse type:16542} Psych History: {ros - neuro psych list:32387} Comorbidities: {assd:16751}      ROS    Objective:     There were no vitals taken for this visit. {Vitals History (Optional):23777}  Physical Exam   No results found for any visits on 05/18/23.  The 10-year ASCVD risk score  (Arnett DK, et al., 2019) is: 1.6%    Assessment & Plan:  There are no diagnoses linked to this encounter.  No follow-ups on file.   Cruzita Lederer Newman Nip, FNP

## 2023-05-18 ENCOUNTER — Ambulatory Visit: Payer: Self-pay | Admitting: Family Medicine

## 2023-05-18 ENCOUNTER — Encounter: Payer: Self-pay | Admitting: Family Medicine

## 2023-05-18 VITALS — BP 121/84 | HR 80 | Ht 63.0 in | Wt 174.0 lb

## 2023-05-18 DIAGNOSIS — E66811 Obesity, class 1: Secondary | ICD-10-CM

## 2023-05-18 DIAGNOSIS — Z6830 Body mass index (BMI) 30.0-30.9, adult: Secondary | ICD-10-CM

## 2023-05-18 DIAGNOSIS — E663 Overweight: Secondary | ICD-10-CM | POA: Insufficient documentation

## 2023-05-18 NOTE — Assessment & Plan Note (Signed)
Started patient on weight management plan Discussed the importance to start eating 3 meals a day including breakfast, drink 8 glasses of water a day ,reduce portion sizes. reduced carbohydrates limit saturated and trans fat, increase servings of vegetables and limit processed foods. Find an activity that you will enjoy and start to be active at least 5 days a week for 30 minutes each day. Keep a food journal or an activity journal to identify triggers that lead to emotional eating Follow up in 4 weeks with PCP to start oral Phentermine

## 2023-06-12 ENCOUNTER — Other Ambulatory Visit (HOSPITAL_COMMUNITY): Payer: Self-pay | Admitting: Obstetrics & Gynecology

## 2023-06-12 DIAGNOSIS — R928 Other abnormal and inconclusive findings on diagnostic imaging of breast: Secondary | ICD-10-CM

## 2023-06-12 DIAGNOSIS — N63 Unspecified lump in unspecified breast: Secondary | ICD-10-CM

## 2023-06-17 ENCOUNTER — Encounter: Payer: Self-pay | Admitting: Obstetrics & Gynecology

## 2023-06-28 ENCOUNTER — Encounter: Payer: Self-pay | Admitting: Internal Medicine

## 2023-06-28 ENCOUNTER — Ambulatory Visit: Payer: Commercial Managed Care - PPO | Admitting: Internal Medicine

## 2023-06-28 VITALS — BP 107/74 | HR 79 | Ht 63.0 in | Wt 177.2 lb

## 2023-06-28 DIAGNOSIS — J3089 Other allergic rhinitis: Secondary | ICD-10-CM

## 2023-06-28 DIAGNOSIS — E66811 Obesity, class 1: Secondary | ICD-10-CM | POA: Diagnosis not present

## 2023-06-28 DIAGNOSIS — R7303 Prediabetes: Secondary | ICD-10-CM | POA: Diagnosis not present

## 2023-06-28 DIAGNOSIS — J309 Allergic rhinitis, unspecified: Secondary | ICD-10-CM | POA: Insufficient documentation

## 2023-06-28 MED ORDER — PHENTERMINE HCL 15 MG PO CAPS
15.0000 mg | ORAL_CAPSULE | ORAL | 0 refills | Status: DC
Start: 2023-06-28 — End: 2023-07-24
  Filled 2023-06-28: qty 30, 30d supply, fill #0

## 2023-06-28 MED ORDER — AZELASTINE HCL 0.1 % NA SOLN
1.0000 | Freq: Two times a day (BID) | NASAL | 5 refills | Status: AC
Start: 2023-06-28 — End: ?
  Filled 2023-06-28: qty 30, 50d supply, fill #0
  Filled 2023-09-04: qty 30, 50d supply, fill #1
  Filled 2023-10-30: qty 30, 50d supply, fill #2
  Filled 2024-03-03: qty 30, 50d supply, fill #3

## 2023-06-28 NOTE — Progress Notes (Signed)
Established Patient Office Visit  Subjective   Patient ID: Wendy Powell, female    DOB: [birth year]  Age: 43 y.o. MRN: [id number]  Chief Complaint  Patient presents with   Care Management    Four week follow up    Wendy Powell returns to care today for weight management.  She was recently seen at [facility] for an acute visit requesting to start medication for weight loss.  Lifestyle modifications aimed at weight loss were reviewed, dietary guidelines provided, and 4-week PCP follow-up recommended.  There have been no acute interval events.  Previously seen by me in June 2024 as a new patient presenting to establish care. Wendy Powell reports feeling well today.  She remains interested in starting medication for weight loss.  She is currently following weight watchers and exercises regularly.  Her additional concern today is persistent rhinitis.  She is using fluticasone nasal spray and cetirizine and would like additional treatment recommendations.  Past Medical History:  Diagnosis Date   History of kidney stones    Vaginal Pap smear, abnormal    Past Surgical History:  Procedure Laterality Date   CESAREAN SECTION     LAPAROSCOPIC UNILATERAL SALPINGECTOMY Right 05/11/2022   Procedure: LAPAROSCOPIC UNILATERAL SALPINGECTOMY;  Surgeon: Lazaro Arms, MD;  Location: [facility];  Service: Gynecology;  Laterality: Right;   LAPAROSCOPIC UNILATERAL SALPINGO OOPHERECTOMY Left 05/11/2022   Procedure: LAPAROSCOPIC UNILATERAL SALPINGO OOPHORECTOMY;  Surgeon: Lazaro Arms, MD;  Location: [facility];  Service: Gynecology;  Laterality: Left;   LEEP     Social History   Tobacco Use   Smoking status: Every Day    Current packs/day: 0.50    Average packs/day: 0.5 packs/day for 17.0 years (8.5 ttl pk-yrs)    Types: Cigarettes   Smokeless tobacco: Never  Vaping Use   Vaping status: Never Used  Substance Use Topics   Alcohol use: Yes    Comment: occ   Drug use: No   Family History  Problem Relation Age of Onset    Diabetes Father    No Known Allergies  Review of Systems  HENT:  Positive for congestion.   All other systems reviewed and are negative.    Objective:     BP 107/74 (BP Location: Left Arm, Patient Position: Sitting, Cuff Size: Normal)   Pulse 79   Ht 5\' 3"  (1.6 m)   Wt 177 lb 3.2 oz (80.4 kg)   LMP 11/08/2022   SpO2 96%   BMI 31.39 kg/m  BP Readings from Last 3 Encounters:  06/28/23 107/74  05/18/23 121/84  12/06/22 115/81   Physical Exam Vitals reviewed.  Constitutional:      General: She is not in acute distress.    Appearance: Normal appearance. She is obese. She is not toxic-appearing.  HENT:     Head: Normocephalic and atraumatic.     Right Ear: External ear normal.     Left Ear: External ear normal.     Nose: Nose normal. No congestion or rhinorrhea.     Mouth/Throat:     Mouth: Mucous membranes are moist.     Pharynx: Oropharynx is clear. No oropharyngeal exudate or posterior oropharyngeal erythema.  Eyes:     General: No scleral icterus.    Extraocular Movements: Extraocular movements intact.     Conjunctiva/sclera: Conjunctivae normal.     Pupils: Pupils are equal, round, and reactive to light.  Cardiovascular:     Rate and Rhythm: Normal rate and regular rhythm.  Pulses: Normal pulses.     Heart sounds: Normal heart sounds. No murmur heard.    No friction rub. No gallop.  Pulmonary:     Effort: Pulmonary effort is normal.     Breath sounds: Normal breath sounds. No wheezing, rhonchi or rales.  Abdominal:     General: Abdomen is flat. Bowel sounds are normal. There is no distension.     Palpations: Abdomen is soft.     Tenderness: There is no abdominal tenderness.  Musculoskeletal:        General: No swelling. Normal range of motion.     Cervical back: Normal range of motion.     Right lower leg: No edema.     Left lower leg: No edema.  Lymphadenopathy:     Cervical: No cervical adenopathy.  Skin:    General: Skin is warm and dry.      Capillary Refill: Capillary refill takes less than 2 seconds.     Coloration: Skin is not jaundiced.  Neurological:     General: No focal deficit present.     Mental Status: She is alert and oriented to person, place, and time.  Psychiatric:        Mood and Affect: Mood normal.        Behavior: Behavior normal.   Last CBC Lab Results  Component Value Date   WBC 7.6 11/23/2022   HGB 12.4 11/23/2022   HCT 38.1 11/23/2022   MCV 92 11/23/2022   MCH 30.0 11/23/2022   RDW 12.9 11/23/2022   PLT 279 11/23/2022   Last metabolic panel Lab Results  Component Value Date   GLUCOSE 91 11/23/2022   NA 142 11/23/2022   K 4.5 11/23/2022   CL 107 (H) 11/23/2022   CO2 21 11/23/2022   BUN 19 11/23/2022   CREATININE 0.78 11/23/2022   EGFR 98 11/23/2022   CALCIUM 9.4 11/23/2022   PROT 6.5 11/23/2022   ALBUMIN 4.4 11/23/2022   LABGLOB 2.1 11/23/2022   BILITOT <0.2 11/23/2022   ALKPHOS 89 11/23/2022   AST 20 11/23/2022   ALT 13 11/23/2022   ANIONGAP 5 05/09/2022   Last lipids Lab Results  Component Value Date   CHOL 154 11/23/2022   HDL 48 11/23/2022   LDLCALC 85 11/23/2022   TRIG 116 11/23/2022   CHOLHDL 3.2 11/23/2022   Last hemoglobin A1c Lab Results  Component Value Date   HGBA1C 5.7 (H) 11/23/2022   Last thyroid functions Lab Results  Component Value Date   TSH 2.040 12/06/2022   T4TOTAL 7.6 12/06/2022   Last vitamin D Lab Results  Component Value Date   VD25OH 16.2 (L) 11/23/2022   Last vitamin B12 and Folate Lab Results  Component Value Date   VITAMINB12 444 11/23/2022   FOLATE 8.4 11/23/2022   The 10-year ASCVD risk score (Arnett DK, et al., 2019) is: 1.4%    Assessment & Plan:   Problem List Items Addressed This Visit       Allergic rhinitis   Persistent issue.  Symptoms not alleviated with regular use of cetirizine and fluticasone.  Azelastine nasal spray added today.      Obesity, Class I, BMI 30-34.9 - Primary   Presenting today for weight  management follow-up.  Recently seen for an acute visit expressing interest in starting medication for weight loss.  She has focused on lifestyle modifications aimed at weight loss.  Currently following weight watchers and exercises regularly.  Phentermine was specifically discussed at her last appointment. -Through  shared decision making, phentermine 15 mg daily was prescribed.  PDMP reviewed and is appropriate.  Lifestyle modifications aimed at weight loss were reinforced.  We will tentatively plan for follow-up in 3 months for weight management.      Return in about 3 months (around 09/26/2023).   Billie Lade, MD

## 2023-06-28 NOTE — Assessment & Plan Note (Signed)
Persistent issue.  Symptoms not alleviated with regular use of cetirizine and fluticasone.  Azelastine nasal spray added today.

## 2023-06-28 NOTE — Patient Instructions (Signed)
It was a pleasure to see you today.  Thank you for giving [place] the opportunity to be involved in your care.  Below is a brief recap of your visit and next steps.  We will plan to see you again in 3 months.  Summary Start phentermine 15 mg daily for weight loss Azelastine nasal spray added  Follow up in 3 months for weight management

## 2023-06-28 NOTE — Assessment & Plan Note (Signed)
Presenting today for weight management follow-up.  Recently seen for an acute visit expressing interest in starting medication for weight loss.  She has focused on lifestyle modifications aimed at weight loss.  Currently following weight watchers and exercises regularly.  Phentermine was specifically discussed at her last appointment. -Through shared decision making, phentermine 15 mg daily was prescribed.  PDMP reviewed and is appropriate.  Lifestyle modifications aimed at weight loss were reinforced.  We will tentatively plan for follow-up in 3 months for weight management.

## 2023-06-29 ENCOUNTER — Other Ambulatory Visit (HOSPITAL_COMMUNITY): Payer: Self-pay

## 2023-06-29 ENCOUNTER — Other Ambulatory Visit: Payer: Self-pay

## 2023-07-11 ENCOUNTER — Encounter (HOSPITAL_COMMUNITY): Payer: Self-pay

## 2023-07-11 ENCOUNTER — Ambulatory Visit (HOSPITAL_COMMUNITY)
Admission: RE | Admit: 2023-07-11 | Discharge: 2023-07-11 | Disposition: A | Discharge status: Home / Self Care | Payer: Commercial Managed Care - PPO | Source: Ambulatory Visit | Attending: Obstetrics & Gynecology | Admitting: Obstetrics & Gynecology

## 2023-07-11 DIAGNOSIS — N6310 Unspecified lump in the right breast, unspecified quadrant: Secondary | ICD-10-CM | POA: Diagnosis not present

## 2023-07-11 DIAGNOSIS — N63 Unspecified lump in unspecified breast: Secondary | ICD-10-CM | POA: Insufficient documentation

## 2023-07-11 DIAGNOSIS — R928 Other abnormal and inconclusive findings on diagnostic imaging of breast: Secondary | ICD-10-CM | POA: Insufficient documentation

## 2023-07-11 DIAGNOSIS — R92323 Mammographic fibroglandular density, bilateral breasts: Secondary | ICD-10-CM | POA: Diagnosis not present

## 2023-07-12 ENCOUNTER — Encounter: Payer: Self-pay | Admitting: Obstetrics & Gynecology

## 2023-07-17 ENCOUNTER — Other Ambulatory Visit (HOSPITAL_COMMUNITY): Payer: Self-pay

## 2023-07-17 MED ORDER — LIFITEGRAST 5 % OP SOLN
1.0000 [drp] | Freq: Two times a day (BID) | OPHTHALMIC | 1 refills | Status: DC
  Filled 2023-07-17: qty 60, 30d supply, fill #0
  Filled 2023-09-04: qty 60, 30d supply, fill #1

## 2023-07-24 ENCOUNTER — Other Ambulatory Visit: Payer: Self-pay | Admitting: Internal Medicine

## 2023-07-24 DIAGNOSIS — E66811 Obesity, class 1: Secondary | ICD-10-CM

## 2023-07-24 DIAGNOSIS — R7303 Prediabetes: Secondary | ICD-10-CM

## 2023-07-25 ENCOUNTER — Other Ambulatory Visit (HOSPITAL_COMMUNITY): Payer: Self-pay

## 2023-07-25 MED ORDER — PHENTERMINE HCL 15 MG PO CAPS
15.0000 mg | ORAL_CAPSULE | ORAL | 0 refills | Status: DC
  Filled 2023-07-25 – 2023-07-27 (×3): qty 30, 30d supply, fill #0

## 2023-07-26 ENCOUNTER — Encounter (HOSPITAL_COMMUNITY): Payer: Self-pay

## 2023-07-26 ENCOUNTER — Other Ambulatory Visit (HOSPITAL_COMMUNITY): Payer: Self-pay

## 2023-07-26 ENCOUNTER — Other Ambulatory Visit: Payer: Self-pay

## 2023-07-27 ENCOUNTER — Other Ambulatory Visit (HOSPITAL_COMMUNITY): Payer: Self-pay

## 2023-07-27 ENCOUNTER — Other Ambulatory Visit: Payer: Self-pay

## 2023-08-13 IMAGING — MG MM DIGITAL SCREENING BILAT W/ TOMO AND CAD
6 of 10 series · 6 of 30 positions shown · non-contrast
Comparison: None.

CLINICAL DATA: Screening.

EXAM:
DIGITAL SCREENING BILATERAL MAMMOGRAM WITH TOMOSYNTHESIS AND CAD
TECHNIQUE: Bilateral screening digital craniocaudal and mediolateral oblique
mammograms were obtained. Bilateral screening digital breast
tomosynthesis was performed. The images were evaluated with
computer-aided detection.

[L CC synth-2D]
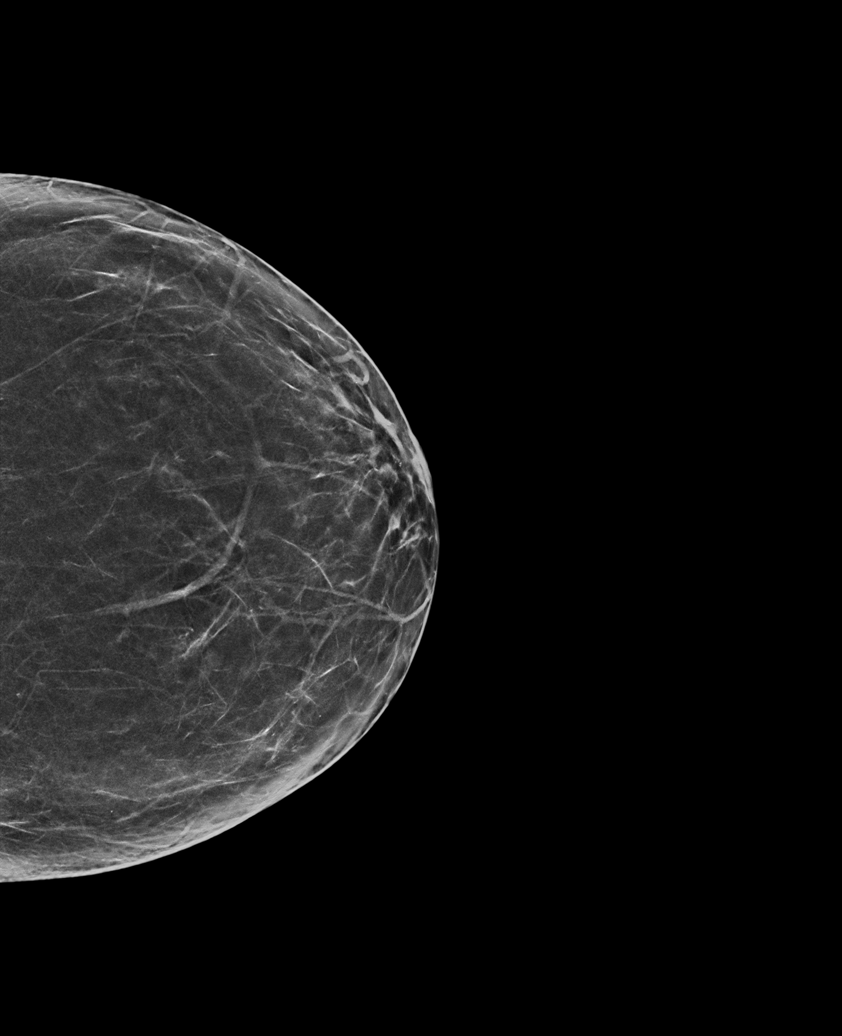

[L MLO synth-2D (1 of 2)]
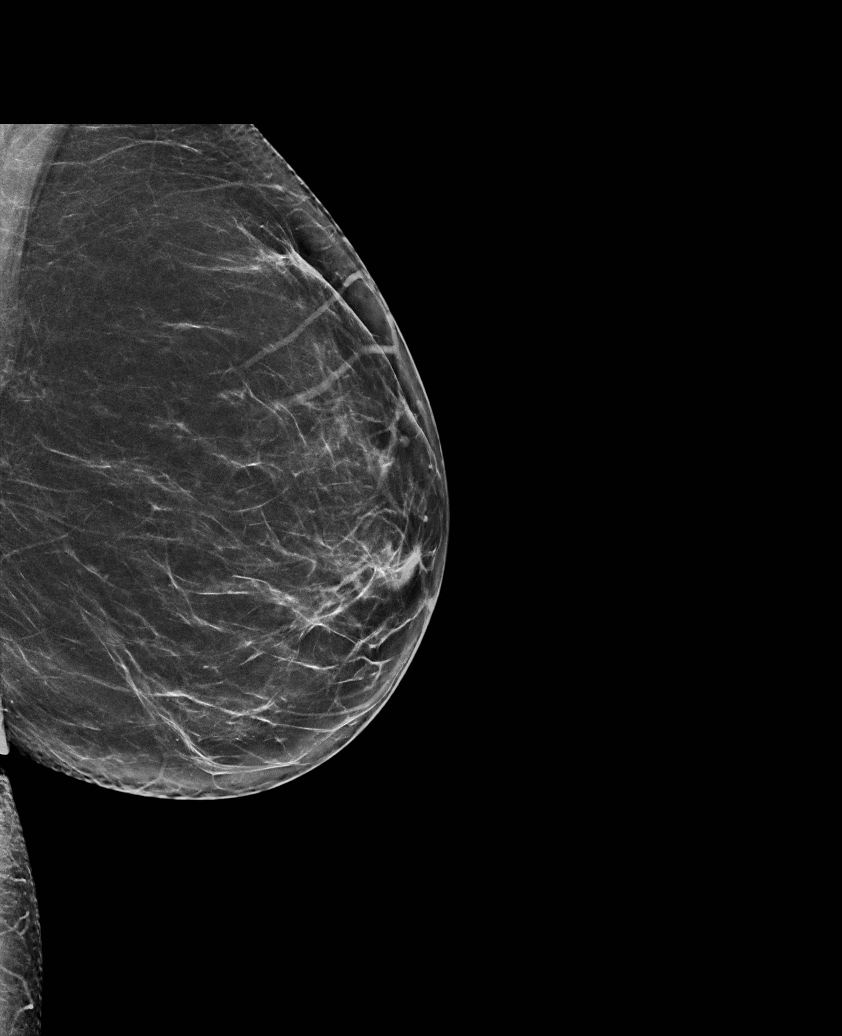

[R MLO synth-2D]
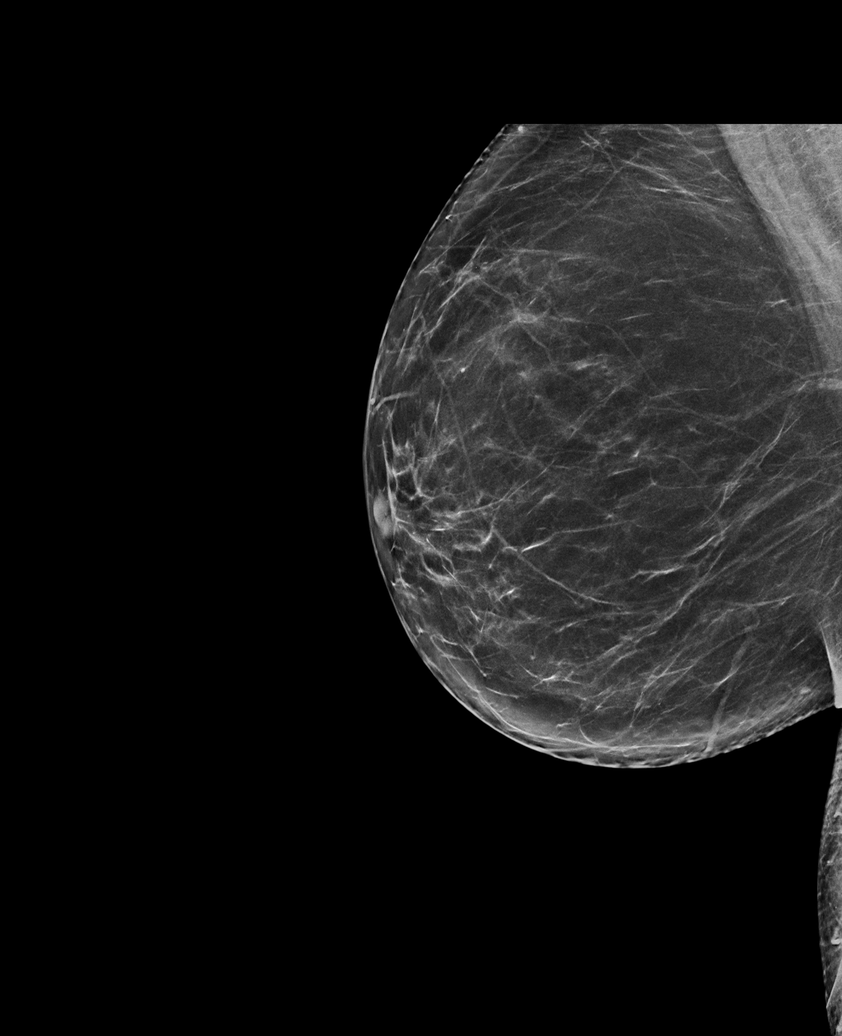

[L MLO synth-2D (2 of 2)]
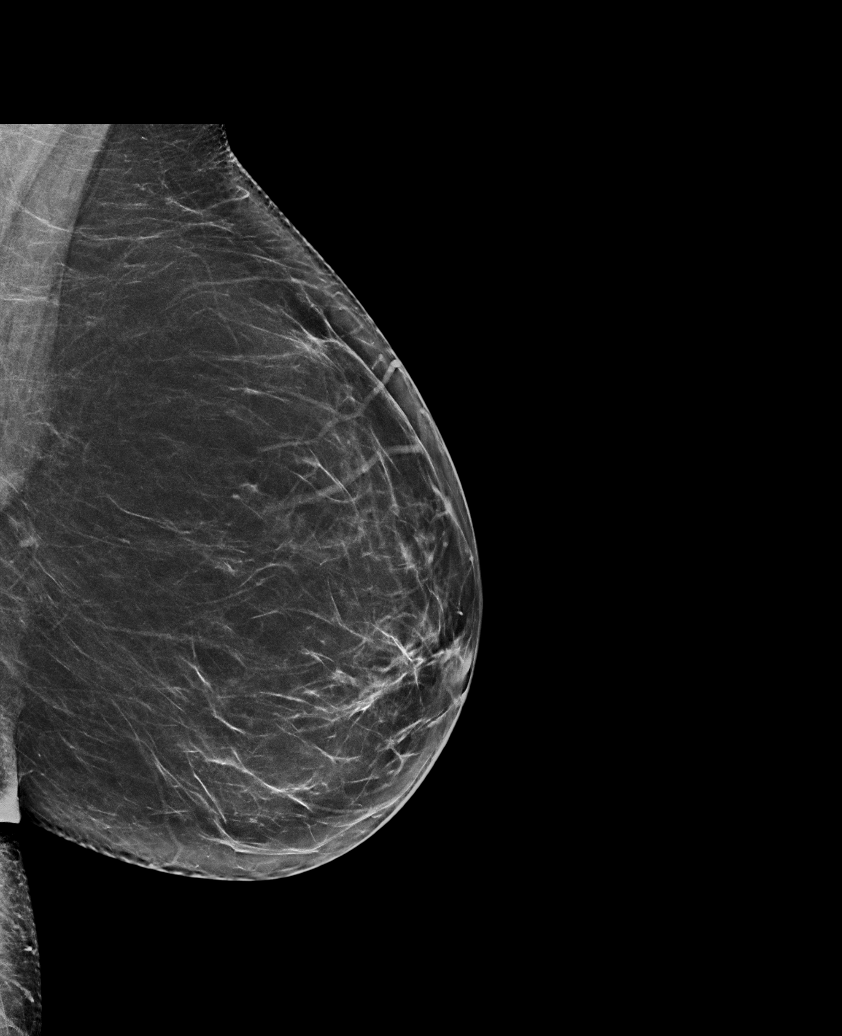

[R CC synth-2D]
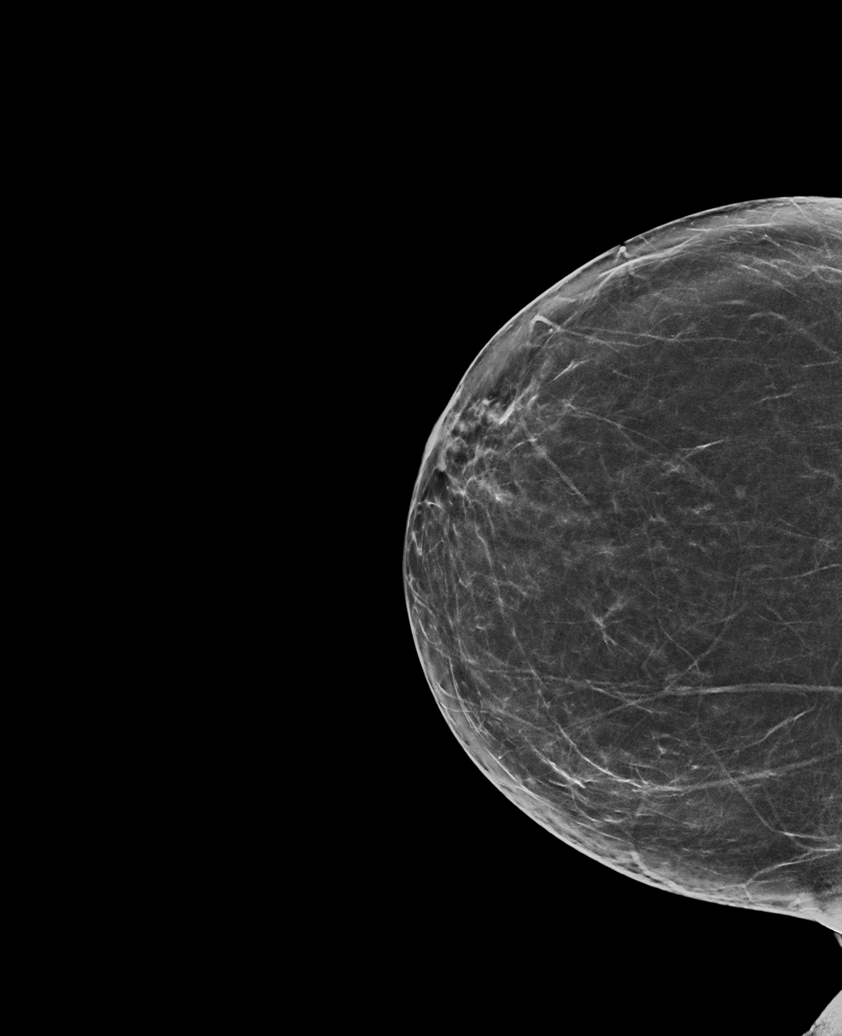

[L MLO tomo · tomo slice 35/69.0]
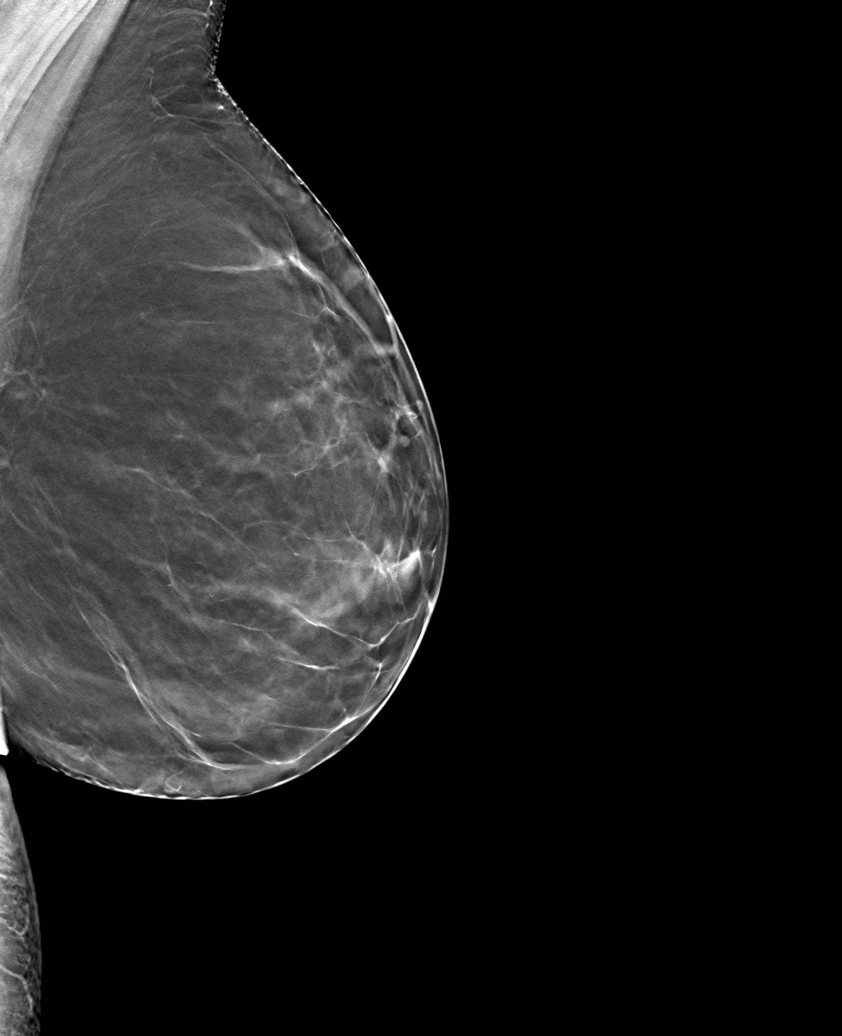

[6 of 30 positions shown; findings below may reference images not displayed]

ACR Breast Density Category b: There are scattered areas of
fibroglandular density.
FINDINGS: There are no findings suspicious for malignancy.
IMPRESSION: No mammographic evidence of malignancy. A result letter of this
screening mammogram will be mailed directly to the patient.

RECOMMENDATION:
Screening mammogram in one year. (Code:[id number])

BI-RADS CATEGORY  1: Negative.

## 2023-08-22 ENCOUNTER — Other Ambulatory Visit: Payer: Self-pay

## 2023-08-22 ENCOUNTER — Encounter: Payer: Self-pay | Admitting: Internal Medicine

## 2023-08-22 ENCOUNTER — Other Ambulatory Visit (HOSPITAL_COMMUNITY): Payer: Self-pay

## 2023-08-22 MED ORDER — PHENTERMINE HCL 37.5 MG PO CAPS
37.5000 mg | ORAL_CAPSULE | ORAL | 0 refills | Status: DC
  Filled 2023-08-22 – 2023-08-25 (×2): qty 30, 30d supply, fill #0

## 2023-08-25 ENCOUNTER — Encounter (HOSPITAL_COMMUNITY): Payer: Self-pay

## 2023-08-25 ENCOUNTER — Other Ambulatory Visit: Payer: Self-pay

## 2023-08-25 ENCOUNTER — Other Ambulatory Visit (HOSPITAL_COMMUNITY): Payer: Self-pay

## 2023-09-04 ENCOUNTER — Other Ambulatory Visit (HOSPITAL_COMMUNITY): Payer: Self-pay

## 2023-09-26 ENCOUNTER — Other Ambulatory Visit (HOSPITAL_COMMUNITY): Payer: Self-pay

## 2023-09-26 ENCOUNTER — Ambulatory Visit: Payer: Commercial Managed Care - PPO | Admitting: Internal Medicine

## 2023-09-26 ENCOUNTER — Encounter: Payer: Self-pay | Admitting: Internal Medicine

## 2023-09-26 VITALS — BP 119/80 | HR 80 | Ht 63.0 in | Wt 164.6 lb

## 2023-09-26 DIAGNOSIS — E66811 Obesity, class 1: Secondary | ICD-10-CM | POA: Diagnosis not present

## 2023-09-26 DIAGNOSIS — R7303 Prediabetes: Secondary | ICD-10-CM | POA: Diagnosis not present

## 2023-09-26 DIAGNOSIS — E663 Overweight: Secondary | ICD-10-CM | POA: Diagnosis not present

## 2023-09-26 DIAGNOSIS — E559 Vitamin D deficiency, unspecified: Secondary | ICD-10-CM | POA: Insufficient documentation

## 2023-09-26 MED ORDER — PHENTERMINE HCL 37.5 MG PO CAPS
37.5000 mg | ORAL_CAPSULE | ORAL | 0 refills | Status: DC
  Filled 2023-09-26: qty 30, 30d supply, fill #0

## 2023-09-26 NOTE — Assessment & Plan Note (Signed)
Noted on previous labs.  Not currently on vitamin D supplementation.  Repeat vitamin D level ordered today.

## 2023-09-26 NOTE — Progress Notes (Signed)
 Established Patient Office Visit  Subjective   Patient ID: Wendy Powell, female    DOB: 1980-07-05  Age: 43 y.o. MRN: [id number]  Chief Complaint  Patient presents with   Care Management    Three month follow up   Ms. Cepero returns to care today for routine follow-up.  She was last evaluated by me on 1/29 at which time phentermine  was started in the setting of obesity.  78-month follow-up was arranged for weight loss management.  There have been no acute interval events.  Today she reports feeling well and has no acute concerns to discuss.  Her weight is down 13 pounds.  Past Medical History:  Diagnosis Date   History of kidney stones    Vaginal Pap smear, abnormal    Past Surgical History:  Procedure Laterality Date   CESAREAN SECTION     LAPAROSCOPIC UNILATERAL SALPINGECTOMY Right 05/11/2022   Procedure: LAPAROSCOPIC UNILATERAL SALPINGECTOMY;  Surgeon: Wendelyn Halter, MD;  Location: [facility];  Service: Gynecology;  Laterality: Right;   LAPAROSCOPIC UNILATERAL SALPINGO OOPHERECTOMY Left 05/11/2022   Procedure: LAPAROSCOPIC UNILATERAL SALPINGO OOPHORECTOMY;  Surgeon: Wendelyn Halter, MD;  Location: [facility];  Service: Gynecology;  Laterality: Left;   LEEP     Social History   Tobacco Use   Smoking status: Every Day    Current packs/day: 0.50    Average packs/day: 0.5 packs/day for 17.0 years (8.5 ttl pk-yrs)    Types: Cigarettes   Smokeless tobacco: Never  Vaping Use   Vaping status: Never Used  Substance Use Topics   Alcohol use: Yes    Comment: occ   Drug use: No   Family History  Problem Relation Age of Onset   Diabetes Father    No Known Allergies  Review of Systems  Constitutional:  Negative for chills and fever.  HENT:  Negative for sore throat.   Respiratory:  Negative for cough and shortness of breath.   Cardiovascular:  Negative for chest pain, palpitations and leg swelling.  Gastrointestinal:  Negative for abdominal pain, blood in stool, constipation,  diarrhea, nausea and vomiting.  Genitourinary:  Negative for dysuria and hematuria.  Musculoskeletal:  Negative for myalgias.  Skin:  Negative for itching and rash.  Neurological:  Negative for dizziness and headaches.  Psychiatric/Behavioral:  Negative for depression and suicidal ideas.      Objective:     BP 119/80   Pulse 80   Ht 5\' 3"  (1.6 m)   Wt 164 lb 9.6 oz (74.7 kg)   LMP 11/08/2022   SpO2 97%   BMI 29.16 kg/m  BP Readings from Last 3 Encounters:  09/26/23 119/80  06/28/23 107/74  05/18/23 121/84   Physical Exam Vitals reviewed.  Constitutional:      General: She is not in acute distress.    Appearance: Normal appearance. She is not toxic-appearing.  HENT:     Head: Normocephalic and atraumatic.     Right Ear: External ear normal.     Left Ear: External ear normal.     Nose: Nose normal. No congestion or rhinorrhea.     Mouth/Throat:     Mouth: Mucous membranes are moist.     Pharynx: Oropharynx is clear. No oropharyngeal exudate or posterior oropharyngeal erythema.  Eyes:     General: No scleral icterus.    Extraocular Movements: Extraocular movements intact.     Conjunctiva/sclera: Conjunctivae normal.     Pupils: Pupils are equal, round, and reactive to light.  Cardiovascular:  Rate and Rhythm: Normal rate and regular rhythm.     Pulses: Normal pulses.     Heart sounds: Normal heart sounds. No murmur heard.    No friction rub. No gallop.  Pulmonary:     Effort: Pulmonary effort is normal.     Breath sounds: Normal breath sounds. No wheezing, rhonchi or rales.  Abdominal:     General: Abdomen is flat. Bowel sounds are normal. There is no distension.     Palpations: Abdomen is soft.     Tenderness: There is no abdominal tenderness.  Musculoskeletal:        General: No swelling. Normal range of motion.     Cervical back: Normal range of motion.     Right lower leg: No edema.     Left lower leg: No edema.  Lymphadenopathy:     Cervical: No  cervical adenopathy.  Skin:    General: Skin is warm and dry.     Capillary Refill: Capillary refill takes less than 2 seconds.     Coloration: Skin is not jaundiced.  Neurological:     General: No focal deficit present.     Mental Status: She is alert and oriented to person, place, and time.  Psychiatric:        Mood and Affect: Mood normal.        Behavior: Behavior normal.   Last CBC Lab Results  Component Value Date   WBC 7.6 11/23/2022   HGB 12.4 11/23/2022   HCT 38.1 11/23/2022   MCV 92 11/23/2022   MCH 30.0 11/23/2022   RDW 12.9 11/23/2022   PLT 279 11/23/2022   Last metabolic panel Lab Results  Component Value Date   GLUCOSE 91 11/23/2022   NA 142 11/23/2022   K 4.5 11/23/2022   CL 107 (H) 11/23/2022   CO2 21 11/23/2022   BUN 19 11/23/2022   CREATININE 0.78 11/23/2022   EGFR 98 11/23/2022   CALCIUM 9.4 11/23/2022   PROT 6.5 11/23/2022   ALBUMIN 4.4 11/23/2022   LABGLOB 2.1 11/23/2022   BILITOT <0.2 11/23/2022   ALKPHOS 89 11/23/2022   AST 20 11/23/2022   ALT 13 11/23/2022   ANIONGAP 5 05/09/2022   Last lipids Lab Results  Component Value Date   CHOL 154 11/23/2022   HDL 48 11/23/2022   LDLCALC 85 11/23/2022   TRIG 116 11/23/2022   CHOLHDL 3.2 11/23/2022   Last hemoglobin A1c Lab Results  Component Value Date   HGBA1C 5.7 (H) 11/23/2022   Last thyroid  functions Lab Results  Component Value Date   TSH 2.040 12/06/2022   T4TOTAL 7.6 12/06/2022   Last vitamin D  Lab Results  Component Value Date   VD25OH 16.2 (L) 11/23/2022   Last vitamin B12 and Folate Lab Results  Component Value Date   VITAMINB12 444 11/23/2022   FOLATE 8.4 11/23/2022   The 10-year ASCVD risk score (Arnett DK, et al., 2019) is: 1.7%    Assessment & Plan:   Problem List Items Addressed This Visit       Overweight (BMI 25.0-29.9) - Primary   Returning to care today for weight management follow-up.  Phentermine  15 mg daily was started in January and was increased  to 37.5 mg daily last month.  Her current weight is 164 pounds, which is down from 177 pounds at her previous appointment.  BMI 29.1.  She was congratulated on her progress and encouraged to maintain lifestyle modifications aimed at weight loss.  Phentermine  37.5 mg daily  refilled today.  Will update basic labs and tentatively plan for follow-up in 3 months.      Prediabetes   A1c 5.7 on labs from June 2024.  She has lost 13 pounds since January.  Repeat A1c ordered today.      Vitamin D  deficiency   Noted on previous labs.  Not currently on vitamin D  supplementation.  Repeat vitamin D  level ordered today.      Return in about 3 months (around 12/26/2023).   Tobi Fortes, MD

## 2023-09-26 NOTE — Assessment & Plan Note (Signed)
 Returning to care today for weight management follow-up.  Phentermine  15 mg daily was started in January and was increased to 37.5 mg daily last month.  Her current weight is 164 pounds, which is down from 177 pounds at her previous appointment.  BMI 29.1.  She was congratulated on her progress and encouraged to maintain lifestyle modifications aimed at weight loss.  Will update basic labs and tentatively plan for follow-up in 3 months.

## 2023-09-26 NOTE — Patient Instructions (Signed)
 It was a pleasure to see you today.  Thank you for giving us  the opportunity to be involved in your care.  Below is a brief recap of your visit and next steps.  We will plan to see you again in 3 months.  Summary Phentermine  refilled Congratulations on your progress Repeat labs ordered Follow up in 3 months

## 2023-09-26 NOTE — Assessment & Plan Note (Signed)
 A1c 5.7 on labs from June 2024.  She has lost 13 pounds since January.  Repeat A1c ordered today.

## 2023-09-27 ENCOUNTER — Encounter: Payer: Self-pay | Admitting: Internal Medicine

## 2023-09-27 ENCOUNTER — Other Ambulatory Visit: Payer: Self-pay | Admitting: Internal Medicine

## 2023-09-27 ENCOUNTER — Other Ambulatory Visit: Payer: Self-pay

## 2023-09-27 DIAGNOSIS — E559 Vitamin D deficiency, unspecified: Secondary | ICD-10-CM

## 2023-09-27 LAB — CMP14+EGFR
ALT: 15 IU/L (ref 0–32)
AST: 19 IU/L (ref 0–40)
Albumin: 4.3 g/dL (ref 3.9–4.9)
Alkaline Phosphatase: 98 IU/L (ref 44–121)
BUN/Creatinine Ratio: 15 (ref 9–23)
BUN: 13 mg/dL (ref 6–24)
Bilirubin Total: <0.2 mg/dL (ref 0.0–1.2)
CO2: 21 mmol/L (ref 20–29)
Calcium: 9.3 mg/dL (ref 8.7–10.2)
Chloride: 103 mmol/L (ref 96–106)
Creatinine, Ser: 0.85 mg/dL (ref 0.57–1.00)
Globulin, Total: 2.1 g/dL (ref 1.5–4.5)
Glucose: 85 mg/dL (ref 70–99)
Potassium: 4.2 mmol/L (ref 3.5–5.2)
Sodium: 139 mmol/L (ref 134–144)
Total Protein: 6.4 g/dL (ref 6.0–8.5)
eGFR: 88 mL/min/{1.73_m2} (ref >59)

## 2023-09-27 LAB — HEMOGLOBIN A1C
Est. average glucose Bld gHb Est-mCnc: 120 mg/dL
Hgb A1c MFr Bld: 5.8 % — ABNORMAL HIGH (ref 4.8–5.6)

## 2023-09-27 LAB — B12 AND FOLATE PANEL
Folate: 10.5 ng/mL (ref >3.0)
Vitamin B-12: 406 pg/mL (ref 232–1245)

## 2023-09-27 LAB — CBC WITH DIFFERENTIAL/PLATELET
Basophils Absolute: 0 10*3/uL (ref 0.0–0.2)
Basos: 0 %
EOS (ABSOLUTE): 0.1 10*3/uL (ref 0.0–0.4)
Eos: 1 %
Hematocrit: 37 % (ref 34.0–46.6)
Hemoglobin: 12.4 g/dL (ref 11.1–15.9)
Immature Grans (Abs): 0 10*3/uL (ref 0.0–0.1)
Immature Granulocytes: 0 %
Lymphocytes Absolute: 2.7 10*3/uL (ref 0.7–3.1)
Lymphs: 34 %
MCH: 30.5 pg (ref 26.6–33.0)
MCHC: 33.5 g/dL (ref 31.5–35.7)
MCV: 91 fL (ref 79–97)
Monocytes Absolute: 0.4 10*3/uL (ref 0.1–0.9)
Monocytes: 5 %
Neutrophils Absolute: 4.8 10*3/uL (ref 1.4–7.0)
Neutrophils: 60 %
Platelets: 292 10*3/uL (ref 150–450)
RBC: 4.06 x10E6/uL (ref 3.77–5.28)
RDW: 13.1 % (ref 11.7–15.4)
WBC: 8.1 10*3/uL (ref 3.4–10.8)

## 2023-09-27 LAB — TSH+FREE T4
Free T4: 1.15 ng/dL (ref 0.82–1.77)
TSH: 1.95 u[IU]/mL (ref 0.450–4.500)

## 2023-09-27 LAB — LIPID PANEL
Chol/HDL Ratio: 3.1 ratio (ref 0.0–4.4)
Cholesterol, Total: 163 mg/dL (ref 100–199)
HDL: 53 mg/dL (ref >39)
LDL Chol Calc (NIH): 95 mg/dL (ref 0–99)
Triglycerides: 78 mg/dL (ref 0–149)
VLDL Cholesterol Cal: 15 mg/dL (ref 5–40)

## 2023-09-27 LAB — VITAMIN D 25 HYDROXY (VIT D DEFICIENCY, FRACTURES): Vit D, 25-Hydroxy: 16.6 ng/mL — ABNORMAL LOW (ref 30.0–100.0)

## 2023-09-27 MED ORDER — VITAMIN D (ERGOCALCIFEROL) 1.25 MG (50000 UNIT) PO CAPS
50000.0000 [IU] | ORAL_CAPSULE | ORAL | 0 refills | Status: AC
Start: 2023-09-27 — End: 2023-12-14

## 2023-10-30 ENCOUNTER — Other Ambulatory Visit: Payer: Self-pay | Admitting: Internal Medicine

## 2023-10-30 ENCOUNTER — Other Ambulatory Visit (HOSPITAL_COMMUNITY): Payer: Self-pay

## 2023-10-30 ENCOUNTER — Other Ambulatory Visit: Payer: Self-pay

## 2023-10-30 DIAGNOSIS — E66811 Obesity, class 1: Secondary | ICD-10-CM

## 2023-10-30 MED ORDER — LIFITEGRAST 5 % OP SOLN
1.0000 [drp] | Freq: Two times a day (BID) | OPHTHALMIC | 1 refills | Status: AC
  Filled 2023-10-30: qty 60, 30d supply, fill #0
  Filled 2023-11-27: qty 60, 30d supply, fill #1

## 2023-10-30 MED ORDER — PHENTERMINE HCL 37.5 MG PO CAPS
37.5000 mg | ORAL_CAPSULE | ORAL | 0 refills | Status: DC
Start: 2023-10-30 — End: 2023-11-27
  Filled 2023-10-30: qty 30, 30d supply, fill #0

## 2023-11-27 ENCOUNTER — Other Ambulatory Visit: Payer: Self-pay

## 2023-11-27 ENCOUNTER — Other Ambulatory Visit: Payer: Self-pay | Admitting: Internal Medicine

## 2023-11-27 DIAGNOSIS — E66811 Obesity, class 1: Secondary | ICD-10-CM

## 2023-11-27 MED ORDER — PHENTERMINE HCL 37.5 MG PO CAPS
37.5000 mg | ORAL_CAPSULE | ORAL | 0 refills | Status: DC
Start: 2023-11-27 — End: 2023-12-29
  Filled 2023-11-27: qty 30, 30d supply, fill #0

## 2023-11-28 ENCOUNTER — Other Ambulatory Visit: Payer: Self-pay

## 2023-11-28 ENCOUNTER — Other Ambulatory Visit (HOSPITAL_COMMUNITY): Payer: Self-pay

## 2023-11-29 ENCOUNTER — Telehealth: Payer: Self-pay | Admitting: Obstetrics & Gynecology

## 2023-11-29 ENCOUNTER — Other Ambulatory Visit: Payer: Self-pay | Admitting: Adult Health

## 2023-11-29 ENCOUNTER — Encounter: Payer: Self-pay | Admitting: Obstetrics & Gynecology

## 2023-11-29 DIAGNOSIS — N760 Acute vaginitis: Secondary | ICD-10-CM

## 2023-11-29 MED ORDER — FLUCONAZOLE 150 MG PO TABS
ORAL_TABLET | ORAL | 3 refills | Status: AC
Start: 2023-11-29 — End: ?

## 2023-11-29 NOTE — Telephone Encounter (Signed)
-   Patient with symptoms of yeast infection including itching and irritation - Tried over-the-counter medicine with no improvement -Rx for Diflucan sent in, patient to follow-up if no improvement  Wendy Dault, DO Attending Obstetrician & Gynecologist, [facility] [employer], [facility]

## 2023-12-13 ENCOUNTER — Other Ambulatory Visit (HOSPITAL_COMMUNITY): Payer: Self-pay

## 2023-12-18 ENCOUNTER — Other Ambulatory Visit: Payer: Self-pay | Admitting: Internal Medicine

## 2023-12-18 DIAGNOSIS — E559 Vitamin D deficiency, unspecified: Secondary | ICD-10-CM

## 2023-12-29 ENCOUNTER — Ambulatory Visit

## 2023-12-29 ENCOUNTER — Other Ambulatory Visit (HOSPITAL_COMMUNITY): Payer: Self-pay

## 2023-12-29 ENCOUNTER — Other Ambulatory Visit: Payer: Self-pay

## 2023-12-29 VITALS — BP 127/85 | HR 85 | Ht 63.0 in | Wt 159.0 lb

## 2023-12-29 DIAGNOSIS — E663 Overweight: Secondary | ICD-10-CM

## 2023-12-29 DIAGNOSIS — E66811 Obesity, class 1: Secondary | ICD-10-CM | POA: Insufficient documentation

## 2023-12-29 MED ORDER — PHENTERMINE HCL 37.5 MG PO CAPS
37.5000 mg | ORAL_CAPSULE | ORAL | 2 refills | Status: DC
  Filled 2023-12-29: qty 30, 30d supply, fill #0
  Filled 2024-02-06: qty 30, 30d supply, fill #1
  Filled 2024-03-03 – 2024-03-05 (×2): qty 30, 30d supply, fill #2

## 2023-12-29 NOTE — Assessment & Plan Note (Signed)
 Returning to care today for weight management follow-up.  She her current weight is 159 pounds, which is down from 177 pounds at initial prescription appointment.  BMI 28.17 which.  She was congratulated on her progress and encouraged to maintain lifestyle modifications aimed at weight loss.  Will tentatively plan for follow-up in 3 months.

## 2023-12-29 NOTE — Progress Notes (Signed)
 Established Patient Office Visit  Subjective   Patient ID: Wendy Powell, female    DOB: [birth year]  Age: 43 y.o. 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  Constitutional:      Appearance: Normal appearance.  HENT:     Head: Normocephalic.  Eyes:     Pupils: Pupils are equal, round, and reactive to light.  Cardiovascular:     Rate and Rhythm: Normal rate and regular rhythm.  Pulmonary:     Effort: Pulmonary effort is normal.     Breath sounds: Normal breath sounds.  Musculoskeletal:     Cervical back: Normal range of motion and neck supple.  Neurological:     Mental Status: She is alert and oriented to person, place, and time.  Psychiatric:        Mood and Affect: Mood normal.        Thought Content: Thought content normal.      No results found for any visits on 12/29/23.  Last CBC Lab Results  Component Value Date   WBC 8.1 09/26/2023   HGB 12.4 09/26/2023   HCT 37.0 09/26/2023    MCV 91 09/26/2023   MCH 30.5 09/26/2023   RDW 13.1 09/26/2023   PLT 292 09/26/2023   Last metabolic panel Lab Results  Component Value Date   GLUCOSE 85 09/26/2023   NA 139 09/26/2023   K 4.2 09/26/2023   CL 103 09/26/2023   CO2 21 09/26/2023   BUN 13 09/26/2023   CREATININE 0.85 09/26/2023   EGFR 88 09/26/2023   CALCIUM 9.3 09/26/2023   PROT 6.4 09/26/2023   ALBUMIN 4.3 09/26/2023   LABGLOB 2.1 09/26/2023   BILITOT <0.2 09/26/2023   ALKPHOS 98 09/26/2023   AST 19 09/26/2023   ALT 15 09/26/2023   ANIONGAP 5 05/09/2022   Last lipids Lab Results  Component Value Date   CHOL 163 09/26/2023   HDL 53 09/26/2023   LDLCALC 95 09/26/2023   TRIG 78 09/26/2023   CHOLHDL 3.1 09/26/2023   Last hemoglobin A1c Lab Results  Component Value Date   HGBA1C 5.8 (H) 09/26/2023   Last thyroid  functions Lab Results  Component Value Date   TSH 1.950 09/26/2023   T4TOTAL 7.6 12/06/2022   Last vitamin D  Lab Results  Component Value Date  VD25OH 16.6 (L) 09/26/2023   Last vitamin B12 and Folate Lab Results  Component Value Date   VITAMINB12 406 09/26/2023   FOLATE 10.5 09/26/2023      The 10-year ASCVD risk score (Arnett DK, et al., 2019) is: 1.8%    Assessment & Plan:   Problem List Items Addressed This Visit       Other   Overweight with body mass index (BMI) 25.0-29.9 - Primary   Returning to care today for weight management follow-up.  She her current weight is 159 pounds, which is down from 177 pounds at initial prescription appointment.  BMI 28.17 which.  She was congratulated on her progress and encouraged to maintain lifestyle modifications aimed at weight loss.  Will tentatively plan for follow-up in 3 months.      Relevant Medications   phentermine  37.5 MG capsule    Return in about 3 months (around 03/30/2024) for chronic follow-up with PCP.    Leita Longs, FNP

## 2024-01-05 ENCOUNTER — Ambulatory Visit: Admitting: *Deleted

## 2024-01-05 ENCOUNTER — Other Ambulatory Visit (HOSPITAL_COMMUNITY)
Admission: RE | Admit: 2024-01-05 | Discharge: 2024-01-05 | Disposition: A | Discharge status: Home / Self Care | Source: Ambulatory Visit | Attending: Obstetrics & Gynecology | Admitting: Obstetrics & Gynecology

## 2024-01-05 DIAGNOSIS — N898 Other specified noninflammatory disorders of vagina: Secondary | ICD-10-CM | POA: Diagnosis not present

## 2024-01-05 NOTE — Progress Notes (Signed)
   NURSE VISIT- VAGINITIS/STD/POC  SUBJECTIVE:  Wendy Powell is a 43 y.o. G2P0 GYN patientfemale here for a vaginal swab for vaginitis screening.  She reports the following symptoms: local irritation and vulvar itching for several days. Denies abnormal vaginal bleeding, significant pelvic pain, fever, or UTI symptoms.  OBJECTIVE:  LMP 11/08/2022   Appears well, in no apparent distress  ASSESSMENT: Vaginal swab for vaginitis screening  PLAN: Self-collected vaginal probe for Bacterial Vaginosis, Yeast sent to lab Treatment: to be determined once results are received Follow-up as needed if symptoms persist/worsen, or new symptoms develop  Alan LITTIE Fischer  01/05/2024 10:02 AM

## 2024-01-08 ENCOUNTER — Ambulatory Visit: Payer: Self-pay | Admitting: Adult Health

## 2024-01-08 LAB — CERVICOVAGINAL ANCILLARY ONLY
Bacterial Vaginitis (gardnerella): NEGATIVE
Candida Glabrata: NEGATIVE
Candida Vaginitis: NEGATIVE
Comment: NEGATIVE
Comment: NEGATIVE
Comment: NEGATIVE

## 2024-01-10 ENCOUNTER — Ambulatory Visit

## 2024-01-10 DIAGNOSIS — N898 Other specified noninflammatory disorders of vagina: Secondary | ICD-10-CM | POA: Diagnosis not present

## 2024-01-10 DIAGNOSIS — R300 Dysuria: Secondary | ICD-10-CM

## 2024-01-10 LAB — POCT URINALYSIS DIPSTICK OB
Blood, UA: NEGATIVE
Glucose, UA: NEGATIVE
Ketones, UA: NEGATIVE
Leukocytes, UA: NEGATIVE
Nitrite, UA: NEGATIVE
POC,PROTEIN,UA: NEGATIVE

## 2024-01-10 NOTE — Progress Notes (Signed)
   NURSE VISIT- UTI SYMPTOMS   SUBJECTIVE:  Wendy Powell is a 43 y.o. G72P0 female here for UTI symptoms. She is a GYN patient. She reports dysuria and and itching. Patient had self swab performed 8/8 and was negative for everything now thinks she has a UTI.  OBJECTIVE:  LMP 11/08/2022   Appears well, in no apparent distress  No results found for this or any previous visit (from the past 24 hours).  ASSESSMENT: GYN patient with UTI symptoms and negative nitrites  PLAN: Note routed to Wendy Powell, AGNP   Rx sent by provider today: No Urine culture sent Call or return to clinic prn if these symptoms worsen or fail to improve as anticipated. Follow-up: as needed   Wendy Powell Wendy Powell  01/10/2024 3:44 PM

## 2024-01-11 ENCOUNTER — Other Ambulatory Visit

## 2024-01-11 DIAGNOSIS — R300 Dysuria: Secondary | ICD-10-CM | POA: Diagnosis not present

## 2024-01-11 LAB — URINALYSIS, ROUTINE W REFLEX MICROSCOPIC

## 2024-01-11 LAB — SPECIMEN STATUS REPORT

## 2024-01-11 NOTE — Addendum Note (Signed)
 Addended by: NEYSA CLARITA RAMAN on: 01/11/2024 12:10 PM   Modules accepted: Orders

## 2024-01-12 ENCOUNTER — Other Ambulatory Visit: Payer: Self-pay | Admitting: Obstetrics & Gynecology

## 2024-01-12 ENCOUNTER — Other Ambulatory Visit (HOSPITAL_COMMUNITY): Payer: Self-pay

## 2024-01-12 ENCOUNTER — Encounter: Payer: Self-pay | Admitting: Obstetrics & Gynecology

## 2024-01-12 LAB — MICROSCOPIC EXAMINATION
Casts: NONE SEEN /LPF
Epithelial Cells (non renal): >10 /HPF — AB (ref 0–10)

## 2024-01-12 LAB — URINALYSIS, ROUTINE W REFLEX MICROSCOPIC
Bilirubin, UA: NEGATIVE
Glucose, UA: NEGATIVE
Ketones, UA: NEGATIVE
Nitrite, UA: NEGATIVE
Protein,UA: NEGATIVE
RBC, UA: NEGATIVE
Specific Gravity, UA: 1.012 (ref 1.005–1.030)
Urobilinogen, Ur: 0.2 mg/dL (ref 0.2–1.0)
pH, UA: 5.5 (ref 5.0–7.5)

## 2024-01-13 LAB — URINE CULTURE

## 2024-01-14 LAB — URINE CULTURE

## 2024-01-15 ENCOUNTER — Ambulatory Visit: Payer: Self-pay | Admitting: Adult Health

## 2024-01-15 ENCOUNTER — Other Ambulatory Visit (HOSPITAL_COMMUNITY): Payer: Self-pay

## 2024-01-15 MED ORDER — NITROFURANTOIN MONOHYD MACRO 100 MG PO CAPS: 100.0000 mg | ORAL_CAPSULE | Freq: Two times a day (BID) | ORAL | 0 refills | Status: DC

## 2024-01-15 MED ORDER — DUAVEE 0.45-20 MG PO TABS
1.0000 | ORAL_TABLET | Freq: Every day | ORAL | 3 refills | Status: AC
  Filled 2024-01-15: qty 90, 90d supply, fill #0

## 2024-01-16 ENCOUNTER — Other Ambulatory Visit: Payer: Self-pay

## 2024-02-06 ENCOUNTER — Other Ambulatory Visit: Payer: Self-pay

## 2024-02-06 ENCOUNTER — Other Ambulatory Visit (HOSPITAL_COMMUNITY): Payer: Self-pay

## 2024-02-13 DIAGNOSIS — M79671 Pain in right foot: Secondary | ICD-10-CM | POA: Diagnosis not present

## 2024-02-13 DIAGNOSIS — M722 Plantar fascial fibromatosis: Secondary | ICD-10-CM | POA: Diagnosis not present

## 2024-02-13 DIAGNOSIS — M79672 Pain in left foot: Secondary | ICD-10-CM | POA: Diagnosis not present

## 2024-02-26 ENCOUNTER — Ambulatory Visit: Admitting: Obstetrics & Gynecology

## 2024-03-04 ENCOUNTER — Other Ambulatory Visit (HOSPITAL_COMMUNITY): Payer: Self-pay

## 2024-03-04 ENCOUNTER — Other Ambulatory Visit: Payer: Self-pay

## 2024-03-05 DIAGNOSIS — M79672 Pain in left foot: Secondary | ICD-10-CM | POA: Diagnosis not present

## 2024-03-05 DIAGNOSIS — M79671 Pain in right foot: Secondary | ICD-10-CM | POA: Diagnosis not present

## 2024-03-05 DIAGNOSIS — M722 Plantar fascial fibromatosis: Secondary | ICD-10-CM | POA: Diagnosis not present

## 2024-04-01 ENCOUNTER — Ambulatory Visit

## 2024-04-01 ENCOUNTER — Other Ambulatory Visit (HOSPITAL_COMMUNITY): Payer: Self-pay

## 2024-04-01 DIAGNOSIS — E663 Overweight: Secondary | ICD-10-CM | POA: Diagnosis not present

## 2024-04-01 MED ORDER — PHENTERMINE HCL 37.5 MG PO CAPS
37.5000 mg | ORAL_CAPSULE | ORAL | 0 refills | Status: AC
  Filled 2024-04-01 – 2024-04-23 (×3): qty 90, 90d supply, fill #0

## 2024-04-01 NOTE — Progress Notes (Signed)
   Established Patient Office Visit  Subjective   Patient ID: Wendy Powell, female    DOB: 05-04-1981  Age: 43 y.o. MRN: [id number]  Chief Complaint  Patient presents with   Medical Management of Chronic Issues    3 month follow up     HPI Discussed the use of AI scribe software for clinical note transcription with the patient, who gave verbal consent to proceed.  History of Present Illness    Wendy Powell is a 43 year old female who presents for a follow-up on phentermine  use.  Pharmacologic weight management - Currently using phentermine  for weight management - Approximately ten pound weight loss since initiation of phentermine  - No adverse effects experienced from phentermine   Hormonal contraception - Continues estrogen-containing birth control regimen  Immunization status - Received influenza vaccination last Monday, administered by charge nurse at workplace     Patient Active Problem List   Diagnosis Date Noted   Prediabetes 09/26/2023   Vitamin D  deficiency 09/26/2023   Allergic rhinitis 06/28/2023   Overweight with body mass index (BMI) 25.0-29.9 05/18/2023   Current tobacco use 11/23/2022   Encounter for well adult exam with abnormal findings 11/23/2022   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  Constitutional:      Appearance: Normal appearance.  HENT:     Head: Normocephalic.  Eyes:     Extraocular  Movements: Extraocular movements intact.     Pupils: Pupils are equal, round, and reactive to light.  Cardiovascular:     Rate and Rhythm: Normal rate and regular rhythm.  Pulmonary:     Effort: Pulmonary effort is normal.     Breath sounds: Normal breath sounds.  Musculoskeletal:     Cervical back: Normal range of motion and neck supple.  Neurological:     Mental Status: She is alert and oriented to person, place, and time.  Psychiatric:        Mood and Affect: Mood normal.        Thought Content: Thought content normal.     No results found for any visits on 04/01/24.    The 10-year ASCVD risk score (Arnett DK, et al., 2019) is: 1.5%    Assessment & Plan:   Problem List Items Addressed This Visit       Other   Overweight with body mass index (BMI) 25.0-29.9   Weight loss of ten pounds with phentermine , no side effects. - Continue phentermine . - Send 90-day prescription to [facility].      Relevant Medications   phentermine  37.5 MG capsule    Return in about 6 months (around 09/29/2024) for chronic follow-up with PCP.    Leita Longs, FNP

## 2024-04-02 ENCOUNTER — Other Ambulatory Visit: Payer: Self-pay

## 2024-04-03 ENCOUNTER — Other Ambulatory Visit: Payer: Self-pay

## 2024-04-07 NOTE — Assessment & Plan Note (Signed)
 Weight loss of ten pounds with phentermine , no side effects. - Continue phentermine . - Send 90-day prescription to [facility].

## 2024-04-08 ENCOUNTER — Other Ambulatory Visit (HOSPITAL_COMMUNITY)
Admission: RE | Admit: 2024-04-08 | Discharge: 2024-04-08 | Disposition: A | Discharge status: Home / Self Care | Source: Ambulatory Visit | Attending: Women's Health | Admitting: Women's Health

## 2024-04-08 ENCOUNTER — Ambulatory Visit: Admitting: Women's Health

## 2024-04-08 ENCOUNTER — Encounter: Payer: Self-pay | Admitting: Women's Health

## 2024-04-08 ENCOUNTER — Other Ambulatory Visit: Payer: Self-pay

## 2024-04-08 ENCOUNTER — Ambulatory Visit: Admitting: Obstetrics & Gynecology

## 2024-04-08 VITALS — BP 124/85 | Ht 63.0 in | Wt 159.0 lb

## 2024-04-08 DIAGNOSIS — Z1151 Encounter for screening for human papillomavirus (HPV): Secondary | ICD-10-CM

## 2024-04-08 DIAGNOSIS — Z124 Encounter for screening for malignant neoplasm of cervix: Secondary | ICD-10-CM | POA: Insufficient documentation

## 2024-04-08 DIAGNOSIS — Z1231 Encounter for screening mammogram for malignant neoplasm of breast: Secondary | ICD-10-CM

## 2024-04-08 DIAGNOSIS — Z780 Asymptomatic menopausal state: Secondary | ICD-10-CM

## 2024-04-08 DIAGNOSIS — Z01419 Encounter for gynecological examination (general) (routine) without abnormal findings: Secondary | ICD-10-CM | POA: Diagnosis not present

## 2024-04-08 DIAGNOSIS — Z1331 Encounter for screening for depression: Secondary | ICD-10-CM | POA: Diagnosis not present

## 2024-04-08 NOTE — Progress Notes (Signed)
 WELL-WOMAN EXAMINATION Patient name: Wendy Powell MRN [id number]  Date of birth: 1981-04-29 Chief Complaint:   Annual Exam  History of Present Illness:   Wendy Powell is a 43 y.o. G2P2 Caucasian female being seen today for a routine well-woman exam.  Current complaints: none No period since June 2024, had LSO/Rt salpingectomy for LLQ pain 05/12/23, depo prior. [facility] 12/06/22 58.8 (postmenopausal range), states mom was in her 40s when she went through menopause. On duavee  since July 2024 for vasomotor sx  PCP: [facility]      does not desire labs Patient's last menstrual period was 11/08/2022. The current method of family planning is tubal ligation.  Last pap 12/06/22. Results were: NILM w/ HRHPV negative. H/O abnormal pap: yes w/ h/o LEEP Last mammogram: 07/11/23. Results were: normal. Family h/o breast cancer: no Last colonoscopy: never. Results were: N/A. Family h/o colorectal cancer: no     04/08/2024    8:47 AM 04/01/2024    3:50 PM 12/29/2023    1:06 PM 09/26/2023    3:31 PM 06/28/2023    3:34 PM  Depression screen PHQ 2/9  Decreased Interest 0 0 0 0 0  Down, Depressed, Hopeless 0 0 0 0 0  PHQ - 2 Score 0 0 0 0 0  Altered sleeping 0 0 0 0 0  Tired, decreased energy 0 0 0 0 1  Change in appetite 0 0 0 0 0  Feeling bad or failure about yourself  0 0 0 0 0  Trouble concentrating 0 0 0 0 0  Moving slowly or fidgety/restless 0 0 0 0 0  Suicidal thoughts 0 0 0 0 0  PHQ-9 Score 0 0  0  0  1   Difficult doing work/chores  Not difficult at all Not difficult at all Not difficult at all Not difficult at all     Data saved with a previous flowsheet row definition        04/08/2024    8:48 AM 04/01/2024    3:50 PM 12/29/2023    1:06 PM 09/26/2023    3:31 PM  GAD 7 : Generalized Anxiety Score  Nervous, Anxious, on Edge 0 0 0 0  Control/stop worrying 0 0 0 0  Worry too much - different things 0 0 0 0  Trouble relaxing 0 0 0 0  Restless 0 0 0 0  Easily annoyed or irritable 1 0 0 0  Afraid -  awful might happen 0 0 0 0  Total GAD 7 Score 1 0 0 0  Anxiety Difficulty  Not difficult at all Not difficult at all Not difficult at all     Review of Systems:   Pertinent items are noted in HPI Denies any headaches, blurred vision, fatigue, shortness of breath, chest pain, abdominal pain, abnormal vaginal discharge/itching/odor/irritation, problems with periods, bowel movements, urination, or intercourse unless otherwise stated above. Pertinent History Reviewed:  Reviewed past medical,surgical, social and family history.  Reviewed problem list, medications and allergies. Physical Assessment:   Vitals:   04/08/24 0840  BP: 124/85  Weight: 159 lb (72.1 kg)  Height: 5' 3 (1.6 m)  Body mass index is 28.17 kg/m.        Physical Examination:   General appearance - well appearing, and in no distress  Mental status - alert, oriented to person, place, and time  Psych:  She has a normal mood and affect  Skin - warm and dry, normal color, no suspicious lesions noted  Chest -  effort normal, all lung fields clear to auscultation bilaterally  Heart - normal rate and regular rhythm  Neck:  midline trachea, no thyromegaly or nodules  Breasts - breasts appear normal, no suspicious masses, no skin or nipple changes or  axillary nodes  Abdomen - soft, nontender, nondistended, no masses or organomegaly  Pelvic - VULVA: normal appearing vulva with no masses, tenderness or lesions  VAGINA: normal appearing vagina with normal color and discharge, no lesions  CERVIX: normal appearing cervix without discharge or lesions, no CMT  Thin prep pap is done w/ HR HPV cotesting  UTERUS: uterus is felt to be normal size, shape, consistency and nontender   ADNEXA: No adnexal masses or tenderness noted.  Extremities:  No swelling or varicosities noted  Chaperone: Aleck Blase  No results found for this or any previous visit (from the past 24 hours).  Assessment & Plan:  1) Well-Woman Exam  2)  Postmenopausal> based on no period since June 2024 and FSH 58.8, on duavee  for vasomotor sx. Discussed to let us  know of any vaginal bleeding  Labs/procedures today: pap  Mammogram: screening mammo ordered for Feb, pt to schedule, or sooner if problems Colonoscopy: @ 43yo, or sooner if problems  Orders Placed This Encounter  Procedures   MM 3D SCREENING MAMMOGRAM BILATERAL BREAST    Meds: No orders of the defined types were placed in this encounter.   Follow-up: Return in about 1 year (around 04/08/2025) for Pap & physical.  Suzen JONELLE Fetters CNM, [facility] 04/08/2024 10:36 AM

## 2024-04-09 ENCOUNTER — Ambulatory Visit: Payer: Self-pay | Admitting: Women's Health

## 2024-04-09 LAB — CYTOLOGY - PAP
Comment: NEGATIVE
Diagnosis: NEGATIVE
High risk HPV: NEGATIVE

## 2024-04-23 ENCOUNTER — Other Ambulatory Visit (HOSPITAL_COMMUNITY): Payer: Self-pay

## 2024-04-24 ENCOUNTER — Other Ambulatory Visit: Payer: Self-pay

## 2024-07-12 ENCOUNTER — Ambulatory Visit (HOSPITAL_COMMUNITY)

## 2024-10-01 ENCOUNTER — Ambulatory Visit
